# Patient Record
Sex: Female | Born: 1950 | Race: Black or African American | Hispanic: No | Marital: Married | Smoking: Never smoker
Health system: Southern US, Community
[De-identification: ages and names within clinical notes are randomized; demographics above are authoritative.]

## PROBLEM LIST (undated history)

## (undated) DIAGNOSIS — I10 Essential (primary) hypertension: Secondary | ICD-10-CM

## (undated) DIAGNOSIS — Q828 Other specified congenital malformations of skin: Secondary | ICD-10-CM

## (undated) DIAGNOSIS — M7580 Other shoulder lesions, unspecified shoulder: Secondary | ICD-10-CM

## (undated) DIAGNOSIS — K649 Unspecified hemorrhoids: Secondary | ICD-10-CM

## (undated) DIAGNOSIS — M179 Osteoarthritis of knee, unspecified: Secondary | ICD-10-CM

## (undated) DIAGNOSIS — M1990 Unspecified osteoarthritis, unspecified site: Secondary | ICD-10-CM

## (undated) DIAGNOSIS — E66811 Obesity, class 1: Secondary | ICD-10-CM

## (undated) DIAGNOSIS — E119 Type 2 diabetes mellitus without complications: Secondary | ICD-10-CM

## (undated) DIAGNOSIS — M24111 Other articular cartilage disorders, right shoulder: Secondary | ICD-10-CM

## (undated) DIAGNOSIS — J309 Allergic rhinitis, unspecified: Secondary | ICD-10-CM

## (undated) DIAGNOSIS — D649 Anemia, unspecified: Secondary | ICD-10-CM

## (undated) DIAGNOSIS — R011 Cardiac murmur, unspecified: Secondary | ICD-10-CM

## (undated) DIAGNOSIS — E669 Obesity, unspecified: Secondary | ICD-10-CM

## (undated) DIAGNOSIS — H60549 Acute eczematoid otitis externa, unspecified ear: Secondary | ICD-10-CM

## (undated) DIAGNOSIS — M75121 Complete rotator cuff tear or rupture of right shoulder, not specified as traumatic: Secondary | ICD-10-CM

## (undated) DIAGNOSIS — I319 Disease of pericardium, unspecified: Secondary | ICD-10-CM

## (undated) HISTORY — PX: COLONOSCOPY: SHX174

## (undated) HISTORY — DX: Essential (primary) hypertension: I10

## (undated) HISTORY — PX: BACK SURGERY: SHX140

## (undated) HISTORY — DX: Allergic rhinitis, unspecified: J30.9

## (undated) HISTORY — PX: TUBAL LIGATION: SHX77

## (undated) HISTORY — PX: TONSILLECTOMY: SUR1361

## (undated) HISTORY — PX: PRE-MALIGNANT / BENIGN SKIN LESION EXCISION: SHX160

## (undated) HISTORY — DX: Acute eczematoid otitis externa, unspecified ear: H60.549

## (undated) HISTORY — PX: ENDOMETRIAL BIOPSY: SHX622

## (undated) HISTORY — DX: Obesity, class 1: E66.811

## (undated) HISTORY — DX: Obesity, unspecified: E66.9

---

## 1988-05-23 HISTORY — PX: SALPINGECTOMY: SHX328

## 2005-01-26 ENCOUNTER — Ambulatory Visit: Payer: Self-pay | Admitting: Unknown Physician Specialty

## 2005-01-31 ENCOUNTER — Ambulatory Visit: Payer: Self-pay | Admitting: Internal Medicine

## 2005-11-21 ENCOUNTER — Ambulatory Visit: Payer: Self-pay | Admitting: Internal Medicine

## 2006-02-01 ENCOUNTER — Ambulatory Visit: Payer: Self-pay | Admitting: Unknown Physician Specialty

## 2007-03-15 ENCOUNTER — Ambulatory Visit: Payer: Self-pay | Admitting: Unknown Physician Specialty

## 2007-03-29 ENCOUNTER — Ambulatory Visit: Payer: Self-pay | Admitting: Gastroenterology

## 2007-05-24 HISTORY — PX: ROTATOR CUFF REPAIR: SHX139

## 2008-03-19 ENCOUNTER — Ambulatory Visit: Payer: Self-pay | Admitting: Unknown Physician Specialty

## 2009-04-30 ENCOUNTER — Ambulatory Visit: Payer: Self-pay | Admitting: Unknown Physician Specialty

## 2009-11-25 ENCOUNTER — Ambulatory Visit: Payer: Self-pay | Admitting: General Surgery

## 2010-05-05 ENCOUNTER — Ambulatory Visit: Payer: Self-pay | Admitting: Unknown Physician Specialty

## 2010-05-23 HISTORY — PX: OTHER SURGICAL HISTORY: SHX169

## 2011-05-11 ENCOUNTER — Ambulatory Visit: Payer: Self-pay | Admitting: Unknown Physician Specialty

## 2012-03-19 ENCOUNTER — Ambulatory Visit: Payer: Self-pay | Admitting: Family Medicine

## 2013-02-08 ENCOUNTER — Other Ambulatory Visit: Payer: Self-pay | Admitting: Unknown Physician Specialty

## 2013-02-08 DIAGNOSIS — Z1231 Encounter for screening mammogram for malignant neoplasm of breast: Secondary | ICD-10-CM

## 2013-05-17 ENCOUNTER — Ambulatory Visit: Payer: Self-pay

## 2014-06-12 ENCOUNTER — Ambulatory Visit: Payer: Self-pay

## 2014-11-20 ENCOUNTER — Ambulatory Visit (INDEPENDENT_AMBULATORY_CARE_PROVIDER_SITE_OTHER): Payer: BC Managed Care – PPO

## 2014-11-20 ENCOUNTER — Other Ambulatory Visit: Payer: Self-pay | Admitting: *Deleted

## 2014-11-20 ENCOUNTER — Ambulatory Visit (INDEPENDENT_AMBULATORY_CARE_PROVIDER_SITE_OTHER): Payer: BC Managed Care – PPO | Admitting: Podiatry

## 2014-11-20 ENCOUNTER — Encounter: Payer: Self-pay | Admitting: Podiatry

## 2014-11-20 VITALS — BP 135/82 | HR 67 | Resp 16 | Ht 66.0 in | Wt 195.0 lb

## 2014-11-20 DIAGNOSIS — I10 Essential (primary) hypertension: Secondary | ICD-10-CM | POA: Insufficient documentation

## 2014-11-20 DIAGNOSIS — K649 Unspecified hemorrhoids: Secondary | ICD-10-CM | POA: Insufficient documentation

## 2014-11-20 DIAGNOSIS — Q828 Other specified congenital malformations of skin: Secondary | ICD-10-CM

## 2014-11-20 DIAGNOSIS — R52 Pain, unspecified: Secondary | ICD-10-CM | POA: Diagnosis not present

## 2014-11-20 DIAGNOSIS — M1990 Unspecified osteoarthritis, unspecified site: Secondary | ICD-10-CM | POA: Insufficient documentation

## 2014-11-21 NOTE — Progress Notes (Signed)
Patient ID: Carollee MassedLynnetta S Korell, female   DOB: 28-Nov-1950, 64 y.o.   MRN: [id number]  Subjective: 64 year old female presents the office today for concerns of the thick callus. Upon her left foot overlying the site of the prior incision with scar tissue. She states that she previously had surgery to the area several years ago and subsequent developed a thick callus over the incision. She states that she comes in periodically to have the area trimmed once it becomes thick and painful. She states that she has started to have pain to the area to weightbearing and pressure to the area which makes shoe gear difficult. She denies any redness or any drainage along the area. No other complaints at this time. No acute changes since last appointment. Denies any systemic complaints as fevers, chills, nausea, vomiting.  Objective: AAO x3, NAD DP/PT pulses palpable bilaterally, CRT less than 3 seconds Protective sensation intact with Simms Weinstein monofilament, vibratory sensation intact, Achilles tendon reflex intact Along the plantar aspect of the foot submetatarsal 4 there is a thick hyperkeratotic lesion over the site of the prior incision. There is tenderness to palpation directly overlying this area. Upon debridement there is 2 areas of annular thick hyperkeratotic tissue. Upon debridement no underlying ulceration, drainage or other clinical signs of infection. No other areas of tenderness to bilateral lower extremities. MMT 5/5, ROM WNL.  No open lesions or pre-ulcerative lesions.  No overlying edema, erythema, increase in warmth to bilateral lower extremities.  No pain with calf compression, swelling, warmth, erythema bilaterally.   Assessment: 64 year old female with left second metatarsal 4 porokeratosis/hyperkeratotic lesion  Plan: -X-rays were obtained and reviewed with the patient.  -Treatment options discussed including all alternatives, risks, and complications -Lesion shop and debrided without  complication/bleeding. Upon was placed around the area followed by salinocaine and an occlusive bandage. Post-procedure care was discussed. Monitor for any clinical signs or symptoms of infection and directed to call the office immediately should any occur or go to the ER. -Follow-up if the symptoms are not resolved in 4-6 weeks or sooner should any problems arise. In the meantime, call the office with any questions/concerns/change in symptoms.   Ovid CurdMatthew Wagoner, DPM

## 2015-05-14 ENCOUNTER — Other Ambulatory Visit: Payer: Self-pay | Admitting: Certified Nurse Midwife

## 2015-05-14 DIAGNOSIS — Z1231 Encounter for screening mammogram for malignant neoplasm of breast: Secondary | ICD-10-CM

## 2015-06-15 ENCOUNTER — Ambulatory Visit
Admission: RE | Admit: 2015-06-15 | Discharge: 2015-06-15 | Disposition: A | Discharge status: Home / Self Care | Payer: BC Managed Care – PPO | Source: Ambulatory Visit | Attending: Certified Nurse Midwife | Admitting: Certified Nurse Midwife

## 2015-06-15 DIAGNOSIS — Z1231 Encounter for screening mammogram for malignant neoplasm of breast: Secondary | ICD-10-CM | POA: Diagnosis present

## 2015-09-01 ENCOUNTER — Encounter: Payer: Self-pay | Admitting: Podiatry

## 2015-09-01 ENCOUNTER — Ambulatory Visit (INDEPENDENT_AMBULATORY_CARE_PROVIDER_SITE_OTHER): Payer: BC Managed Care – PPO | Admitting: Podiatry

## 2015-09-01 DIAGNOSIS — M79673 Pain in unspecified foot: Secondary | ICD-10-CM

## 2015-09-01 DIAGNOSIS — Q828 Other specified congenital malformations of skin: Secondary | ICD-10-CM

## 2015-09-01 NOTE — Progress Notes (Signed)
Patient ID: Krista MassedLynnetta S Morris, female   DOB: 01/30/1951, 65 y.o.   MRN: [id number]  Subjective: 65 year old female presents the office today for concerns of the thick callus to the ball the left fourth toe and both fifth toes. The areas are painful with pressure in shoe gear. No redness or swelling. She did trim her left big toenail and had some bleeding but denies any swelling or redness or any drainage or pus at this time. No other complaints at this time.  Denies any systemic complaints as fevers, chills, nausea, vomiting.  Objective: AAO x3, NAD DP/PT pulses palpable bilaterally, CRT less than 3 seconds Along the plantar aspect of the foot submetatarsal 4 there is a thick hyperkeratotic lesion over the site of a prior incision. There is tenderness to palpation directly overlying this area. Hyperkeratotic lesions bilateral fifth toes. Upon debridement there is no underlying ulceration, drainage or other signs of infection to any lesions. The left big toenail has been trimming very sure there is evidence of dried blood. There is no signs of infection at this time. No swelling or redness or any drainage. No other areas of tenderness to bilateral lower extremities. MMT 5/5, ROM WNL.  No open lesions or pre-ulcerative lesions.  No overlying edema, erythema, increase in warmth to bilateral lower extremities.  No pain with calf compression, swelling, warmth, erythema bilaterally.   Assessment: 65 year old female with left second metatarsal 4, fifth digit bilaterally porokeratosis/hyperkeratotic lesion  Plan: -X-rays were obtained and reviewed with the patient.  -Treatment options discussed including all alternatives, risks, and complications -Calluses debrided 3 without complications or bleeding. -Recommended Epson salt soaks and Neosporin and a bandage to the left big toe. Monitor for signs or symptoms of infection/ingrown toenail. -Follow-up as needed.  Ovid CurdMatthew Wagoner, DPM

## 2016-06-06 ENCOUNTER — Other Ambulatory Visit: Payer: Self-pay | Admitting: Certified Nurse Midwife

## 2016-06-06 DIAGNOSIS — Z1231 Encounter for screening mammogram for malignant neoplasm of breast: Secondary | ICD-10-CM

## 2016-07-05 ENCOUNTER — Ambulatory Visit
Admission: RE | Admit: 2016-07-05 | Discharge: 2016-07-05 | Disposition: A | Discharge status: Home / Self Care | Payer: BC Managed Care – PPO | Source: Ambulatory Visit | Attending: Certified Nurse Midwife | Admitting: Certified Nurse Midwife

## 2016-07-05 DIAGNOSIS — Z1231 Encounter for screening mammogram for malignant neoplasm of breast: Secondary | ICD-10-CM | POA: Diagnosis not present

## 2016-09-19 ENCOUNTER — Ambulatory Visit (INDEPENDENT_AMBULATORY_CARE_PROVIDER_SITE_OTHER): Payer: BC Managed Care – PPO | Admitting: Podiatry

## 2016-09-19 DIAGNOSIS — Q828 Other specified congenital malformations of skin: Secondary | ICD-10-CM

## 2016-09-19 DIAGNOSIS — L84 Corns and callosities: Secondary | ICD-10-CM

## 2016-09-19 NOTE — Progress Notes (Signed)
This patient presents the office with chief complaint of painful calluses on the bottom of her left foot and on her fifth toes both feet. She states that there is a painful from walking and wearing her shoes. She is unable to self treat. She presents the office today for evaluation and treatment of these painful calluses  GENERAL APPEARANCE: Alert, conversant. Appropriately groomed. No acute distress.  VASCULAR: Pedal pulses are  palpable at  [facility] and PT bilateral.  Capillary refill time is immediate to all digits,  Normal temperature gradient.  Digital hair growth is present bilateral  NEUROLOGIC: sensation is normal to 5.07 monofilament at 5/5 sites bilateral.  Light touch is intact bilateral, Muscle strength normal.  MUSCULOSKELETAL: acceptable muscle strength, tone and stability bilateral.  Intrinsic muscluature intact bilateral.  Rectus appearance of foot and digits noted bilateral.  Hammer toes  B/L  DERMATOLOGIC: skin color, texture, and turgor are within normal limits.  No preulcerative lesions or ulcers  are seen, no interdigital maceration noted.  No open lesions present.  Digital nails are asymptomatic. No drainage noted. Thick hyperkeratotic lesion over the plate previous plantar incision left foot. This is present, sub-fourth left. She also has painful heloma dorms fifth toes bilaterally   Porokeratosis left foot.  Heloma durum fifth toes  B/L  Debridement of hyperkeratotic lesions  B/L. Helane Gunther DPM

## 2017-02-03 ENCOUNTER — Other Ambulatory Visit: Payer: Self-pay | Admitting: Certified Nurse Midwife

## 2017-05-18 ENCOUNTER — Ambulatory Visit (INDEPENDENT_AMBULATORY_CARE_PROVIDER_SITE_OTHER): Payer: BC Managed Care – PPO | Admitting: Certified Nurse Midwife

## 2017-05-18 ENCOUNTER — Encounter: Payer: Self-pay | Admitting: Certified Nurse Midwife

## 2017-05-18 VITALS — BP 138/72 | HR 57 | Ht 66.0 in | Wt 207.0 lb

## 2017-05-18 DIAGNOSIS — Z124 Encounter for screening for malignant neoplasm of cervix: Secondary | ICD-10-CM

## 2017-05-18 DIAGNOSIS — Z01419 Encounter for gynecological examination (general) (routine) without abnormal findings: Secondary | ICD-10-CM

## 2017-05-18 DIAGNOSIS — Z1231 Encounter for screening mammogram for malignant neoplasm of breast: Secondary | ICD-10-CM | POA: Diagnosis not present

## 2017-05-18 DIAGNOSIS — Z1239 Encounter for other screening for malignant neoplasm of breast: Secondary | ICD-10-CM

## 2017-05-18 LAB — HM PAP SMEAR: HM PAP: NORMAL

## 2017-05-18 MED ORDER — CLOTRIMAZOLE-BETAMETHASONE 1-0.05 % EX CREA: 1.0000 "application " | TOPICAL_CREAM | Freq: Two times a day (BID) | CUTANEOUS | 0 refills | Status: DC

## 2017-05-18 NOTE — Progress Notes (Signed)
Gynecology Annual Exam  PCP: System, Provider Not In  Chief Complaint:  Chief Complaint  Patient presents with  . Gynecologic Exam    History of Present Illness:Mckyla Azucena CecilBurton is a 66 year old African American/Black female , G 3 P 2 0 1 2 , who presents for her annual exam . She is not having any significant gyn problems. Was using Estrace cream last year, but ran out and did not get it refilled  Her menses are absent and she is postmenopausal. She does have vaginal dryness and that responds to Estrace cream 2x/week. Vasomotor symptoms are no longer problematic.Marland Kitchen.  She has had no spotting.   The patient's past medical history is notable for a history of hypertension, obesity, and lower back pain which she states is from arthritis. 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 There is a scarring there that results in a fold of tissue prone to sweating and irritation. Is area has responded to MYcolog in the past. She requests a refill of the Mycolog since her symptoms have returned.  Since her last annual GYN exam dated 05/13/2016, she has maintained an exercise program. She has received her flu shot this year. She was also treated for a sinusitis in November. She is   Her most recent pap smear was obtained 05/13/2016 and was NIL/ negative HRHPV  Her most recent mammogram obtained on 07/05/2016  was normal.  There is a positive history of breast cancer in her two paternal cousins-aged 53 and 40. Genetic testing has not been done.  There is no family history of ovarian cancer.  The patient does do occ monthly self breast exams.  She had a colonoscopy in 2015 that was normal . Her next colonoscopy is due in 7-10 years.  She denies a recent DEXA scan and [facility]  The patient does not smoke.  The patient does not drink alcohol.  The patient does not use illegal drugs.  The patient  exercises regularly The patient does get adequate calcium in her diet.  She had a recent cholesterol screen in 2018 by PCP Dr Burnett ShengHedrick that was normal.      The patient denies current symptoms of depression.    Review of Systems: Review of Systems  Constitutional: Negative for chills, fever and weight loss.  HENT: Negative for congestion, sinus pain and sore throat.   Eyes: Negative for blurred vision and pain.  Respiratory: Negative for hemoptysis, shortness of breath and wheezing.   Cardiovascular: Negative for chest pain, palpitations and leg swelling.  Gastrointestinal: Negative for abdominal pain, blood in stool, diarrhea, heartburn, nausea and vomiting.  Genitourinary: Negative for dysuria, frequency, hematuria and urgency.  Musculoskeletal: Positive for back pain. Negative for joint pain and myalgias.  Skin: Negative for itching and rash.  Neurological: Negative for dizziness, tingling and headaches.  Endo/Heme/Allergies: Negative for environmental allergies and polydipsia. Does not bruise/bleed easily.       Negative for hirsutism   Psychiatric/Behavioral: Negative for depression. The patient is not nervous/anxious and does not have insomnia.     Past Medical History:  Past Medical History:  Diagnosis Date  . Allergic rhinitis   . Hypertension     Past Surgical History:  Past Surgical History:  Procedure Laterality Date  . bone spur removal  2012   from foot  . CESAREAN SECTION  1982, [phone]  Dr Francoise Schaumann  . COLONOSCOPY  2004, 2015   hemorrhoids in 2004 and in 2015 normal screening  . ENDOMETRIAL BIOPSY  2000, 2002   disordered proliferative; weakly proliferative  . PRE-MALIGNANT / BENIGN SKIN LESION EXCISION     right thigh; ulcerated fibroepithelial polyp  . ROTATOR CUFF REPAIR Right 2009  . SALPINGECTOMY Right 1990   ectopic   . TONSILLECTOMY    . TUBAL LIGATION      Family History:  Family History  Problem Relation Age of Onset  . Hypertension Mother   .  Diabetes Father        died from complications of DM  . Hypertension Sister   . Hypertension Brother   . Breast cancer Cousin        paternal x2, age 4 and age 49    Social History:  Social History   Socioeconomic History  . Marital status: Married    Spouse name: Brett Canales  . Number of children: 2  . Years of education: 51  . Highest education level: Not on file  Social Needs  . Financial resource strain: Not on file  . Food insecurity - worry: Not on file  . Food insecurity - inability: Not on file  . Transportation needs - medical: Not on file  . Transportation needs - non-medical: Not on file  Occupational History  . Occupation: Geneticist, molecular  Tobacco Use  . Smoking status: Never Smoker  . Smokeless tobacco: Never Used  Substance and Sexual Activity  . Alcohol use: No    Alcohol/week: 0.0 oz    Frequency: Never  . Drug use: No  . Sexual activity: Yes    Partners: Male    Birth control/protection: Post-menopausal  Other Topics Concern  . Not on file  Social History Narrative  . Not on file    Allergies:  Allergies  Allergen Reactions  . Metronidazole Other (See Comments)    Medications:  Current Outpatient Medications:  .  amLODipine (NORVASC) 10 MG tablet, Take 10 mg by mouth daily., Disp: , Rfl: 2 .  Cholecalciferol (VITAMIN D3) 1000 UNITS CAPS, Take by mouth., Disp: , Rfl:  .  hydrochlorothiazide (HYDRODIURIL) 25 MG tablet, Take by mouth., Disp: , Rfl:  .  losartan (COZAAR) 50 MG tablet, Take 50 mg by mouth daily., Disp: , Rfl: 3 .  metoprolol tartrate (LOPRESSOR) 50 MG tablet, TAKE 1 AND 1/2 TABLETS BY MOUTH TWICE A DAY, Disp: , Rfl:  .  nystatin-triamcinolone ointment (MYCOLOG), APPLY TO AFFECTED AREA 2 -3 TIMES DAILY, Disp: , Rfl:  .  triamcinolone cream (KENALOG) 0.1 %, APPLY TOPICALLY 2 (TWO) TIMES DAILY. DO NOT USE LONGER THAN 2 CONSECUTIVE WEEKS., Disp: , Rfl: 0 .  clotrimazole-betamethasone (LOTRISONE) cream, Apply 1 application topically 2 (two)  times daily., Disp: 30 g, Rfl: 0   Physical Exam Vitals: BP 138/72   Pulse (!) 57   Ht 5\' 6"  (1.676 m)   Wt 207 lb (93.9 kg)   BMI 33.41 kg/m   General: BF in NAD HEENT: normocephalic, anicteric Neck: no thyroid enlargement, no palpable nodules, no cervical lymphadenopathy  Pulmonary: No increased work of breathing, CTAB Cardiovascular: RRR, grade II-III/VI systolic murmur at base  Breast: Breast symmetrical, no tenderness, no palpable nodules or masses, no skin or nipple retraction present, no nipple discharge.  No axillary, infraclavicular or supraclavicular lymphadenopathy. Abdomen: Soft, non-tender, non-distended.  Umbilicus without lesions.  No hepatomegaly or masses palpable. No evidence of hernia. Vertical and transverse scars on lower  abdomen intersect. There is a keloid on the vertical scar that causes the skin above and below the transverse incision to press together. There are several areas of  Inflammation on either side of the keloid. Genitourinary:  External: Normal external female genitalia.  Normal urethral meatus, normal Bartholin's and Skene's glands.    Vagina: Normal vaginal mucosa, no evidence of prolapse.    Cervix: Grossly normal in appearance, no bleeding, non-tender  Uterus: Anteverted, normal size, shape, and consistency, mobile, and non-tender  Adnexa: No adnexal masses, non-tender  Rectal: deferred  Lymphatic: no evidence of inguinal lymphadenopathy Extremities: no edema, erythema, or tenderness Neurologic: Grossly intact Psychiatric: mood appropriate, affect full     Assessment: 66 y.o. annual gyn exam Possible fungal infection lower abdomen  Lotrisone BID to AA prn. Gold bond after inflammation clears.  Plan:                                                                                                                                                                                                 1) Breast cancer 1) 1) Breast cancers creening -  recommend monthly self breast exam and continued annual mammograms Mammogram was ordered today.  2 Colon cancer screening: Colonoscopy  is UTD.Marland Kitchen.  3) Cervical cancer screening - Pap was done. ASCCP guidelines and rational discussed.  Patient opts for every 2 years screening interval  4) Osteoporosis screening-Dexa scan next year.  5) Routine healthcare maintenance including cholesterol and diabetes screening managed by PCP  6) RTO 1 year and prn  Farrel Connersolleen Curley [place], CNM

## 2017-05-19 LAB — IGP,RFX APTIMA HPV ALL PTH: PAP SMEAR COMMENT: 0

## 2017-05-21 ENCOUNTER — Encounter: Payer: Self-pay | Admitting: Certified Nurse Midwife

## 2017-07-06 ENCOUNTER — Ambulatory Visit
Admission: RE | Admit: 2017-07-06 | Discharge: 2017-07-06 | Disposition: A | Discharge status: Home / Self Care | Payer: BC Managed Care – PPO | Source: Ambulatory Visit | Attending: Certified Nurse Midwife | Admitting: Certified Nurse Midwife

## 2017-07-06 DIAGNOSIS — Z1231 Encounter for screening mammogram for malignant neoplasm of breast: Secondary | ICD-10-CM | POA: Diagnosis present

## 2017-07-06 DIAGNOSIS — Z1239 Encounter for other screening for malignant neoplasm of breast: Secondary | ICD-10-CM

## 2018-05-24 ENCOUNTER — Encounter: Payer: Self-pay | Admitting: Certified Nurse Midwife

## 2018-05-24 ENCOUNTER — Ambulatory Visit (INDEPENDENT_AMBULATORY_CARE_PROVIDER_SITE_OTHER): Payer: Medicare Other | Admitting: Certified Nurse Midwife

## 2018-05-24 VITALS — BP 124/72 | Ht 65.0 in | Wt 208.0 lb

## 2018-05-24 DIAGNOSIS — Z1239 Encounter for other screening for malignant neoplasm of breast: Secondary | ICD-10-CM

## 2018-05-24 DIAGNOSIS — Z01419 Encounter for gynecological examination (general) (routine) without abnormal findings: Secondary | ICD-10-CM | POA: Diagnosis not present

## 2018-05-24 DIAGNOSIS — N9419 Other specified dyspareunia: Secondary | ICD-10-CM

## 2018-05-24 DIAGNOSIS — Z780 Asymptomatic menopausal state: Secondary | ICD-10-CM

## 2018-05-24 MED ORDER — ESTRADIOL STARTER PACK 4 MCG VA INST: 1.0000 | VAGINAL_INSERT | Freq: Every day | VAGINAL | 0 refills | Status: DC

## 2018-05-24 MED ORDER — ESTRADIOL 4 MCG VA INST: 1.0000 | VAGINAL_INSERT | VAGINAL | 11 refills | Status: DC

## 2018-05-24 NOTE — Progress Notes (Signed)
Gynecology Annual Exam  PCP: System, Provider Not In  Chief Complaint:  Chief Complaint  Patient presents with  . Annual Exam    History of Present Illness:Krista Morris is a 68 year old African American/Black female , G 3 P 2 0 1 2 , who presents for her annual exam . She is having problems with vaginal dryness. Used Estrace cream in the past. Is interested in other options. Her menses are absent and she is postmenopausal.  Vasomotor symptoms are no longer problematic.Marland Kitchen.  She has had no spotting.   The patient's past medical history is notable for a history of hypertension, obesity, and lower back pain which she states is from arthritis. 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 There is a scarring there that results in a fold of tissue prone to sweating and irritation. Is area has responded to MYcolog in the past.  Since her last annual GYN exam dated 05/18/2017, she has retired from Agricultural consultantteaching and has been exercising more.  She has received her flu shot this year.   Her most recent pap smear was obtained 05/18/17 and was NIL. Her most recent mammogram obtained on 07/06/17 was normal.  There is a positive history of breast cancer in her two paternal cousins-aged 66 and 40. Genetic testing has not been done.  There is no family history of ovarian cancer.  The patient does do occ monthly self breast exams.  She had a colonoscopy in 2015 that was normal . Her next colonoscopy is due in 7-10 years.  She denies a recent DEXA scan and is eligible The patient does not smoke.  The patient does not drink alcohol.  The patient does not use illegal drugs.  The patient exercises regularly (water aerobics and Zumba) The patient does get adequate calcium in her diet.  She had a recent cholesterol screen in 2019 by PCP Dr Burnett ShengHedrick that was normal.      The patient denies current symptoms of depression.     Review of Systems: Review of Systems  Constitutional: Negative for chills, fever and weight loss.  HENT: Negative for congestion, sinus pain and sore throat.   Eyes: Negative for blurred vision and pain.  Respiratory: Negative for hemoptysis, shortness of breath and wheezing.   Cardiovascular: Negative for chest pain, palpitations and leg swelling.  Gastrointestinal: Negative for abdominal pain, blood in stool, diarrhea, heartburn, nausea and vomiting.  Genitourinary: Positive for frequency and urgency. Negative for dysuria and hematuria.       Positive for painful intercourse, vaginal dryness  Musculoskeletal: Positive for back pain. Negative for joint pain and myalgias.  Skin: Positive for itching. Negative for rash.  Neurological: Negative for dizziness, tingling and headaches.  Endo/Heme/Allergies: Negative for environmental allergies and polydipsia. Does not bruise/bleed easily.       Negative for hirsutism   Psychiatric/Behavioral: Negative for depression. The patient is not nervous/anxious and does not have insomnia.     Past Medical History:  Past Medical History:  Diagnosis Date  . Allergic rhinitis   . Hypertension     Past Surgical History:  Past Surgical History:  Procedure Laterality Date  . bone spur removal  2012   from foot  . CESAREAN SECTION  1982, 1985   Dr Francoise SchaumannBaird  . COLONOSCOPY  2004, 2015   hemorrhoids in 2004 and in 2015 normal screening  . ENDOMETRIAL BIOPSY  2000, 2002  disordered proliferative; weakly proliferative  . PRE-MALIGNANT / BENIGN SKIN LESION EXCISION     right thigh; ulcerated fibroepithelial polyp  . ROTATOR CUFF REPAIR Right 2009  . SALPINGECTOMY Right 1990   ectopic   . TONSILLECTOMY    . TUBAL LIGATION      Family History:  Family History  Problem Relation Age of Onset  . Hypertension Mother   . Diabetes Father        died from complications of DM  . Hypertension Sister   . Hypertension Brother   . Breast cancer Cousin         paternal x2, age 66 and age 66    Social History:  Social History   Socioeconomic History  . Marital status: Married    Spouse name: Brett CanalesSteve  . Number of children: 2  . Years of education: 4816  . Highest education level: Not on file  Occupational History  . Occupation: Geneticist, molecularAcademic Coach  Social Needs  . Financial resource strain: Not on file  . Food insecurity:    Worry: Not on file    Inability: Not on file  . Transportation needs:    Medical: Not on file    Non-medical: Not on file  Tobacco Use  . Smoking status: Never Smoker  . Smokeless tobacco: Never Used  Substance and Sexual Activity  . Alcohol use: No    Alcohol/week: 0.0 standard drinks    Frequency: Never  . Drug use: No  . Sexual activity: Yes    Partners: Male    Birth control/protection: Post-menopausal  Lifestyle  . Physical activity:    Days per week: 0 days    Minutes per session: 0 min  . Stress: Only a little  Relationships  . Social connections:    Talks on phone: More than three times a week    Gets together: Once a week    Attends religious service: More than 4 times per year    Active member of club or organization: Yes    Attends meetings of clubs or organizations: More than 4 times per year    Relationship status: Married  . Intimate partner violence:    Fear of current or ex partner: No    Emotionally abused: No    Physically abused: No    Forced sexual activity: No  Other Topics Concern  . Not on file  Social History Narrative  . Not on file    Allergies:  Allergies  Allergen Reactions  . Metronidazole Other (See Comments)    Medications:  Current Outpatient Medications:  .  amLODipine (NORVASC) 10 MG tablet, Take 10 mg by mouth daily., Disp: , Rfl: 2 .  Cholecalciferol (VITAMIN D3) 1000 UNITS CAPS, Take by mouth., Disp: , Rfl:  .  clotrimazole-betamethasone (LOTRISONE) cream, Apply 1 application topically 2 (two) times daily., Disp: 30 g, Rfl: 0 .  hydrochlorothiazide  (HYDRODIURIL) 25 MG tablet, Take by mouth., Disp: , Rfl:  .  losartan (COZAAR) 50 MG tablet, Take 50 mg by mouth daily., Disp: , Rfl: 3 .  metoprolol tartrate (LOPRESSOR) 50 MG tablet, TAKE 1 AND 1/2 TABLETS BY MOUTH TWICE A DAY, Disp: , Rfl:  .  nystatin-triamcinolone ointment (MYCOLOG), APPLY TO AFFECTED AREA 2 -3 TIMES DAILY, Disp: , Rfl:  .  triamcinolone cream (KENALOG) 0.1 %, APPLY TOPICALLY 2 (TWO) TIMES DAILY. DO NOT USE LONGER THAN 2 CONSECUTIVE WEEKS., Disp: , Rfl: 0 Zyrtec 10 mgm qhs  Physical Exam Vitals: BP 124/72  Ht 5\' 5"  (1.651 m)   Wt 208 lb (94.3 kg)   BMI 34.61 kg/m   General: BF in NAD HEENT: normocephalic, anicteric Neck: no thyroid enlargement, no palpable nodules, no cervical lymphadenopathy  Pulmonary: No increased work of breathing, CTAB Cardiovascular: RRR, grade II/VI systolic murmur at base  Breast: Breast symmetrical, no tenderness, no palpable nodules or masses, no skin or nipple retraction present, no nipple discharge.  No axillary, infraclavicular or supraclavicular lymphadenopathy. Abdomen: Soft, non-tender, non-distended.  Umbilicus without lesions.  No hepatomegaly or masses palpable. No evidence of hernia. Vertical and transverse scars on lower abdomen intersect. There is a keloid on the vertical scar that causes the skin above and below the transverse incision to press together.  Genitourinary:  External: Normal external female genitalia.  Normal urethral meatus, normal Bartholin's and Skene's glands.    Vagina: Normal vaginal mucosa, no evidence of prolapse.    Cervix: no masses, no bleeding, non-tender  Uterus: Anteverted, normal size, shape, and consistency, mobile, and non-tender  Adnexa: No adnexal masses, non-tender  Rectal: deferred  Lymphatic: no evidence of inguinal lymphadenopathy Extremities: no edema, erythema, or tenderness Neurologic: Grossly intact Psychiatric: mood appropriate, affect full     Assessment: 67 y.o. annual gyn  exam Dyspareunia due to vaginal dryness Plan:                                                                                                                                                                                                1) Breast cancers creening - recommend monthly self breast exam and continued annual mammograms Mammogram was ordered today.  2 Colon cancer screening: Colonoscopy  is UTD.Marland Kitchen  3) Cervical cancer screening - Pap was not done. ASCCP guidelines and rational discussed.  Patient opts for every 2 years screening interval. Next due in 2021. Discussed option for treatment of genital atrophy. Patient desires to try Imvexxy. Given samples of 4 mcg. Insert qhs x 2 weeks then 2x/wk.   4) Osteoporosis screening-Dexa scan ordered  5) Routine healthcare maintenance including cholesterol and diabetes screening managed by PCP  6) RTO 1 year and prn  Farrel Conners, CNM

## 2018-05-25 ENCOUNTER — Telehealth: Payer: Self-pay | Admitting: Certified Nurse Midwife

## 2018-05-25 NOTE — Telephone Encounter (Signed)
-----   Message from Farrel Conners, [place] sent at 05/24/2018 10:16 PM EST ----- Regarding: DEXA scan [facility] schedule D[facility]A scan at [facility]

## 2018-05-25 NOTE — Telephone Encounter (Signed)
Patient is aware of DEXA and screening mammogram on Monday, 07/09/2018 @ 10:20am at [facility].

## 2018-07-09 ENCOUNTER — Ambulatory Visit
Admission: RE | Admit: 2018-07-09 | Discharge: 2018-07-09 | Disposition: A | Discharge status: Home / Self Care | Payer: Medicare Other | Source: Ambulatory Visit | Attending: Certified Nurse Midwife | Admitting: Certified Nurse Midwife

## 2018-07-09 DIAGNOSIS — Z780 Asymptomatic menopausal state: Secondary | ICD-10-CM

## 2018-07-09 DIAGNOSIS — Z1239 Encounter for other screening for malignant neoplasm of breast: Secondary | ICD-10-CM

## 2018-07-09 DIAGNOSIS — M8588 Other specified disorders of bone density and structure, other site: Secondary | ICD-10-CM | POA: Insufficient documentation

## 2018-07-09 DIAGNOSIS — Z1231 Encounter for screening mammogram for malignant neoplasm of breast: Secondary | ICD-10-CM | POA: Diagnosis not present

## 2018-07-16 ENCOUNTER — Telehealth: Payer: Self-pay | Admitting: Certified Nurse Midwife

## 2018-07-16 MED ORDER — ESTRADIOL 0.1 MG/GM VA CREA: TOPICAL_CREAM | VAGINAL | 1 refills | Status: DC

## 2018-07-16 NOTE — Telephone Encounter (Signed)
Patient called with results of mammogram and DEXA scan. Mammogram was negative. DExa scan T score of spine -1.3 and or femur neck 0.2. Frax scores 2.7% and 0.1%.  Would like to switch from Imvexy to Estrace vaginal cream RX for Estrace called in.  Farrel Conners, CNM

## 2018-08-14 ENCOUNTER — Other Ambulatory Visit: Payer: Self-pay | Admitting: Certified Nurse Midwife

## 2019-01-08 ENCOUNTER — Other Ambulatory Visit: Payer: Self-pay | Admitting: Certified Nurse Midwife

## 2019-04-12 ENCOUNTER — Other Ambulatory Visit: Payer: Self-pay

## 2019-04-12 DIAGNOSIS — Z20822 Contact with and (suspected) exposure to covid-19: Secondary | ICD-10-CM

## 2019-04-15 LAB — NOVEL CORONAVIRUS, NAA: SARS-CoV-2, NAA: NOT DETECTED

## 2019-05-31 ENCOUNTER — Other Ambulatory Visit: Payer: Self-pay | Admitting: Certified Nurse Midwife

## 2019-05-31 DIAGNOSIS — Z1231 Encounter for screening mammogram for malignant neoplasm of breast: Secondary | ICD-10-CM

## 2019-06-13 ENCOUNTER — Ambulatory Visit: Payer: Medicare PPO | Attending: Internal Medicine

## 2019-06-13 DIAGNOSIS — Z23 Encounter for immunization: Secondary | ICD-10-CM

## 2019-06-17 NOTE — Progress Notes (Signed)
   Covid-19 Vaccination Clinic  Name:  Krista Morris    MRN: [id number] DOB: 16-Jun-1950  06/13/2019  [name] was observed post Covid-19 immunization for 15 minutes without incidence. She was provided with Vaccine Information Sheet and instruction to access the V-Safe system.   [name] was instructed to call 911 with any severe reactions post vaccine: Marland Kitchen Difficulty breathing  . Swelling of your face and throat  . A fast heartbeat  . A bad rash all over your body  . Dizziness and weakness    Immunizations Administered    Name Date Dose VIS Date Route   Moderna COVID-19 Vaccine 06/13/2019  6:43 PM 0.5 mL 04/23/2019 Intramuscular   Manufacturer: Moderna   Lot: [id number]   NDC: 62563-893-73

## 2019-06-24 ENCOUNTER — Other Ambulatory Visit (HOSPITAL_COMMUNITY)
Admission: RE | Admit: 2019-06-24 | Discharge: 2019-06-24 | Disposition: A | Discharge status: Home / Self Care | Payer: Medicare PPO | Source: Ambulatory Visit | Attending: Certified Nurse Midwife | Admitting: Certified Nurse Midwife

## 2019-06-24 ENCOUNTER — Encounter: Payer: Self-pay | Admitting: Certified Nurse Midwife

## 2019-06-24 ENCOUNTER — Other Ambulatory Visit: Payer: Self-pay

## 2019-06-24 ENCOUNTER — Ambulatory Visit (INDEPENDENT_AMBULATORY_CARE_PROVIDER_SITE_OTHER): Payer: Medicare PPO | Admitting: Certified Nurse Midwife

## 2019-06-24 VITALS — BP 130/76 | HR 65 | Ht 65.0 in | Wt 202.0 lb

## 2019-06-24 DIAGNOSIS — Z124 Encounter for screening for malignant neoplasm of cervix: Secondary | ICD-10-CM

## 2019-06-24 DIAGNOSIS — Z1231 Encounter for screening mammogram for malignant neoplasm of breast: Secondary | ICD-10-CM

## 2019-06-24 DIAGNOSIS — Z01419 Encounter for gynecological examination (general) (routine) without abnormal findings: Secondary | ICD-10-CM | POA: Diagnosis not present

## 2019-06-24 NOTE — Progress Notes (Signed)
Gynecology Annual Exam  PCP: System, Provider Not In  Chief Complaint:  Chief Complaint  Patient presents with  . Gynecologic Exam    History of Present Illness:Krista Morris is a 69 year old African American/Black female , G 3 P 2 0 1 2 , who presents for her annual exam . She has had problems with vaginal dryness and has tried Imvexxy and Estrace more recently. Has stopped using Estrace. Her menses are absent and she is postmenopausal.  Vasomotor symptoms are no longer problematic.Marland Kitchen  She has had no spotting.   The patient's past medical history is notable for a history of hypertension, obesity, and lower back pain which she states is from arthritis. 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 There is a scarring there that results in a fold of tissue prone to sweating and irritation. This  area has responded to MYcolog/ Lotrisone in the past. She has also had a recurring skin irritation under her breasts and athlete's foot.  Since her last annual GYN exam dated 05/24/2018, she has lost 6 #. She did receive her flu vaccine and her first dose of Covid 19 vaccine. She complains of urinary frequency and urgency incontinence "for a while". Has nocturia twice a night. The nocturia has decreased since her diuretic dose was decreased. She does drink fluids into the evening hours. Drinks at least 64 oz of water, 1 cup of coffee, one cup of tea,, and 8oz of soda a day. Denies dysuria.  Her most recent pap smear was obtained 05/18/17 and was NIL. Her most recent mammogram obtained on 07/09/2018 was normal.  There is a positive history of breast cancer in her two paternal cousins-aged 69 and 62. Genetic testing has not been done.  There is no family history of ovarian cancer.  The patient does do monthly self breast exams.  She had a colonoscopy in 2015 that was normal . Her next colonoscopy is due in 2025.  She had  a recent DEXA scan 07/09/2018. The T score for the femur was normal and the spine was -1.3 The patient does not smoke.  The patient does not drink alcohol.  The patient does not use illegal drugs.  The patient exercises regularly (doing Zumba) The patient does get adequate calcium in her diet.  She had a recent cholesterol screen in 2020 by PCP Dr Kary Kos that was normal and her hemoglobin A1C had improved and was 5.9%.     The patient denies current symptoms of depression.    Review of Systems: Review of Systems  Constitutional: Negative for chills, fever and weight loss.  HENT: Negative for congestion, sinus pain and sore throat.   Eyes: Negative for blurred vision and pain.  Respiratory: Negative for hemoptysis, shortness of breath and wheezing.   Cardiovascular: Negative for chest pain, palpitations and leg swelling.  Gastrointestinal: Negative for abdominal pain, blood in stool, diarrhea, heartburn, nausea and vomiting.  Genitourinary: Positive for frequency and urgency. Negative for dysuria and hematuria.       Positive for vaginal dryness and urgency incontinence  Musculoskeletal: Positive for back pain. Negative for joint pain and myalgias.  Skin: Positive for itching (under right breast). Negative for rash.  Neurological: Negative for dizziness, tingling and headaches.  Endo/Heme/Allergies: Negative for environmental allergies and polydipsia. Does not bruise/bleed easily.       Negative for hirsutism   Psychiatric/Behavioral: Negative for  depression. The patient is not nervous/anxious and does not have insomnia.     Past Medical History:  Past Medical History:  Diagnosis Date  . Allergic rhinitis   . Eczema of external ear   . Hypertension   . Obesity (BMI 30.0-34.9)     Past Surgical History:  Past Surgical History:  Procedure Laterality Date  . bone spur removal  2012   from foot  . CESAREAN SECTION  1982, 1985   Dr Francoise Schaumann  . COLONOSCOPY  2004, 2015    hemorrhoids in 2004 and in 2015 normal screening  . ENDOMETRIAL BIOPSY  2000, 2002   disordered proliferative; weakly proliferative  . PRE-MALIGNANT / BENIGN SKIN LESION EXCISION     right thigh; ulcerated fibroepithelial polyp  . ROTATOR CUFF REPAIR Right 2009  . SALPINGECTOMY Right 1990   ectopic   . TONSILLECTOMY    . TUBAL LIGATION      Family History:  Family History  Problem Relation Age of Onset  . Hypertension Mother   . Diabetes Father        died from complications of DM  . Hypertension Sister   . Hypertension Brother   . Breast cancer Cousin        paternal x2, age 50 and age 75    Social History:  Social History   Socioeconomic History  . Marital status: Married    Spouse name: Brett Canales  . Number of children: 2  . Years of education: 75  . Highest education level: Not on file  Occupational History  . Occupation: retired Runner, broadcasting/film/video  Tobacco Use  . Smoking status: Never Smoker  . Smokeless tobacco: Never Used  Substance and Sexual Activity  . Alcohol use: No    Alcohol/week: 0.0 standard drinks  . Drug use: No  . Sexual activity: Yes    Partners: Male    Birth control/protection: Post-menopausal  Other Topics Concern  . Not on file  Social History Narrative  . Not on file   Social Determinants of Health   Financial Resource Strain:   . Difficulty of Paying Living Expenses: Not on file  Food Insecurity:   . Worried About Programme researcher, broadcasting/film/video in the Last Year: Not on file  . Ran Out of Food in the Last Year: Not on file  Transportation Needs:   . Lack of Transportation (Medical): Not on file  . Lack of Transportation (Non-Medical): Not on file  Physical Activity:   . Days of Exercise per Week: Not on file  . Minutes of Exercise per Session: Not on file  Stress:   . Feeling of Stress : Not on file  Social Connections:   . Frequency of Communication with Friends and Family: Not on file  . Frequency of Social Gatherings with Friends and Family: Not on  file  . Attends Religious Services: Not on file  . Active Member of Clubs or Organizations: Not on file  . Attends Banker Meetings: Not on file  . Marital Status: Not on file  Intimate Partner Violence:   . Fear of Current or Ex-Partner: Not on file  . Emotionally Abused: Not on file  . Physically Abused: Not on file  . Sexually Abused: Not on file    Allergies:  Allergies  Allergen Reactions  . Metronidazole Other (See Comments)    Medications:  Current Outpatient Medications:  .  amLODipine (NORVASC) 10 MG tablet, Take 10 mg by mouth daily., Disp: , Rfl: 2 .  cetirizine (ZYRTEC) 10 MG tablet, Take 10 mg by mouth at bedtime., Disp: , Rfl:  .  Cholecalciferol (VITAMIN D3) 1000 UNITS CAPS, Take by mouth., Disp: , Rfl:  .  clotrimazole-betamethasone (LOTRISONE) cream, Apply 1 application topically 2 (two) times daily., Disp: 30 g, Rfl: 0 .  Fluocinolone Acetonide 0.01 % OIL, Place 2 drops into both ears daily., Disp: , Rfl:  .  hydrochlorothiazide (HYDRODIURIL) 12.5 MG tablet, Take 12.5 mg by mouth daily., Disp: , Rfl:  .  ipratropium (ATROVENT) 0.03 % nasal spray, Place 2 sprays into the nose as needed., Disp: , Rfl:  .  losartan (COZAAR) 50 MG tablet, Take 50 mg by mouth daily., Disp: , Rfl: 3 .  metoprolol tartrate (LOPRESSOR) 50 MG tablet, TAKE 1 AND 1/2 TABLETS BY MOUTH TWICE A DAY, Disp: , Rfl:  .  nystatin-triamcinolone ointment (MYCOLOG), APPLY TO AFFECTED AREA 2 -3 TIMES DAILY, Disp: 30 g, Rfl: 1 .  triamcinolone cream (KENALOG) 0.1 %, APPLY TOPICALLY 2 (TWO) TIMES DAILY. DO NOT USE LONGER THAN 2 CONSECUTIVE WEEKS., Disp: , Rfl: 0  Physical Exam Vitals: BP 130/76   Pulse 65   Ht 5\' 5"  (1.651 m)   Wt 202 lb (91.6 kg)   BMI 33.61 kg/m   General: BF in NAD HEENT: normocephalic, anicteric Neck: no thyroid enlargement, no palpable nodules, no cervical lymphadenopathy  Pulmonary: No increased work of breathing, CTAB Cardiovascular: RRR, without murmur   Breast: Breast symmetrical, no tenderness, no palpable nodules or masses, no skin or nipple retraction present, no nipple discharge.  No axillary, infraclavicular or supraclavicular lymphadenopathy. Mild inflammation below right breast Abdomen: Soft, non-tender, non-distended.  Umbilicus without lesions.  No hepatomegaly or masses palpable. No evidence of hernia. Vertical and transverse scars on lower abdomen intersect. There is a keloid on the vertical scar that causes the skin above and below the transverse incision to press together.  Genitourinary:  External: Normal external female genitalia.  Normal urethral meatus, normal Bartholin's and Skene's glands.    Vagina: Normal vaginal mucosa, no evidence of prolapse.    Cervix: no masses, no bleeding, non-tender  Uterus: Anteverted, normal size, shape, and consistency, mobile, and non-tender  Adnexa: No adnexal masses, non-tender  Rectal: deferred  Lymphatic: no evidence of inguinal lymphadenopathy Extremities: no edema, erythema, or tenderness Neurologic: Grossly intact Psychiatric: mood appropriate, affect full     Assessment: 69 y.o. annual gyn exam Urgency incontinence and frequency  May need to decrease fluid intake and stop drinking earlier in the afternoon   Plan:  1) Breast cancer screening - recommend monthly self breast exam and continued annual mammograms Mammogram was ordered today.  2 Colon cancer screening: Colonoscopy  is UTD.Marland Kitchen  3) Cervical cancer screening - Pap was done. ASCCP guidelines and rational discussed.  Patient opts for every 2 years screening interval. .   4) Osteoporosis screening-Aware of calcium and vitamin D3 requirements and role of exercise in preventing osteoporosis.   5) Routine healthcare maintenance including  cholesterol and diabetes screening managed by PCP  6) RTO 1 year and prn  Farrel Conners, CNM

## 2019-06-26 LAB — CYTOLOGY - PAP: Diagnosis: NEGATIVE

## 2019-07-11 ENCOUNTER — Ambulatory Visit: Payer: Medicare PPO

## 2019-07-11 ENCOUNTER — Encounter: Payer: Self-pay | Admitting: Certified Nurse Midwife

## 2019-07-16 ENCOUNTER — Ambulatory Visit: Payer: Medicare PPO | Attending: Internal Medicine

## 2019-07-16 DIAGNOSIS — Z23 Encounter for immunization: Secondary | ICD-10-CM

## 2019-07-16 NOTE — Progress Notes (Signed)
   Covid-19 Vaccination Clinic  Name:  [name]    MRN: [id number] DOB: 30-Jan-1951  07/16/2019  [name] was observed post Covid-19 immunization for 15 minutes without incidence. She was provided with Vaccine Information Sheet and instruction to access the V-Safe system.   [name] was instructed to call 911 with any severe reactions post vaccine: Marland Kitchen Difficulty breathing  . Swelling of your face and throat  . A fast heartbeat  . A bad rash all over your body  . Dizziness and weakness    Immunizations Administered    Name Date Dose VIS Date Route   Moderna COVID-19 Vaccine 07/16/2019  5:16 PM 0.5 mL 04/23/2019 Intramuscular   Manufacturer: Moderna   Lot: [id number]   NDC: 23799-094-00

## 2019-08-21 ENCOUNTER — Ambulatory Visit
Admission: RE | Admit: 2019-08-21 | Discharge: 2019-08-21 | Disposition: A | Discharge status: Home / Self Care | Payer: Medicare PPO | Source: Ambulatory Visit | Attending: Certified Nurse Midwife | Admitting: Certified Nurse Midwife

## 2019-08-21 DIAGNOSIS — Z1231 Encounter for screening mammogram for malignant neoplasm of breast: Secondary | ICD-10-CM | POA: Diagnosis not present

## 2019-09-18 ENCOUNTER — Encounter: Payer: Self-pay | Admitting: Dermatology

## 2019-10-02 ENCOUNTER — Ambulatory Visit: Payer: Medicare PPO | Admitting: Dermatology

## 2019-10-09 ENCOUNTER — Other Ambulatory Visit: Payer: Self-pay

## 2019-10-09 ENCOUNTER — Ambulatory Visit (INDEPENDENT_AMBULATORY_CARE_PROVIDER_SITE_OTHER): Payer: Medicare PPO | Admitting: Dermatology

## 2019-10-09 ENCOUNTER — Encounter: Payer: Medicare PPO | Admitting: Dermatology

## 2019-10-09 DIAGNOSIS — L300 Nummular dermatitis: Secondary | ICD-10-CM | POA: Diagnosis not present

## 2019-10-09 DIAGNOSIS — L304 Erythema intertrigo: Secondary | ICD-10-CM

## 2019-10-09 DIAGNOSIS — L720 Epidermal cyst: Secondary | ICD-10-CM

## 2019-10-09 DIAGNOSIS — L219 Seborrheic dermatitis, unspecified: Secondary | ICD-10-CM

## 2019-10-09 MED ORDER — DESOXIMETASONE 0.25 % EX CREA: TOPICAL_CREAM | CUTANEOUS | 2 refills | Status: DC

## 2019-10-09 MED ORDER — HYDROCORTISONE 2.5 % EX CREA: TOPICAL_CREAM | CUTANEOUS | 3 refills | Status: DC

## 2019-10-09 MED ORDER — MOMETASONE FUROATE 0.1 % EX SOLN: CUTANEOUS | 2 refills | Status: DC

## 2019-10-09 MED ORDER — KETOCONAZOLE 2 % EX CREA: TOPICAL_CREAM | CUTANEOUS | 2 refills | Status: DC

## 2019-10-09 NOTE — Progress Notes (Signed)
   New Patient Visit  Subjective  Krista Morris is a 69 y.o. female who presents for the following: Rash (reocurring rash in groin and under breast x 5 years, has been using nystatin/TMC ointment prn rash), Mass (present for about 4 years, in right groin. No pain or itch assoiciated with this.), and dry spot (present for 5 years, on lower back and right underarm area, itchy).  The following portions of the chart were reviewed this encounter and updated as appropriate:      Review of Systems:  No other skin or systemic complaints except as noted in HPI or Assessment and Plan.  Objective  Well appearing patient in no apparent distress; mood and affect are within normal limits.  A focused examination was performed including Under breasts and legs. Relevant physical exam findings are noted in the Assessment and Plan.  Objective  Right lower ingunial crease:  1.3 cm X 1.0 cm Subcutaneous firm nodule.   Objective  Right Inframammary Fold: Well demarcated violaceous/hyperpigmented patches with mild scale R lateral inframammary fold  Objective  Right Ankle Anterior, Right Posterior  Axilla, Right spinal lower back: Hyperpigmented scaly coin shaped patches right spinal lower back and right post axilla Hyperpigmented patch mild scale R ankle  (h/o injury in that area in past)  Objective  B/L ear: Hyperpigmented patches with scale BL ear canals. Itches per patient.   Assessment & Plan  Epidermal inclusion cyst Right lower ingunial crease  Reassured benign growth.  Recommend observation.  Discussed surgical excision in office if changes noted or symptomatic.   Erythema intertrigo Right Inframammary Fold  Reviewed chronic nature.   D/C  Nystatin/TMC ointment- discussed risk of skin atrophy in body folds with chronic use  Start ketoconazole 2% cream  Start Hydrocortisone cream 2.5 %   Mix hydrocortisone with ketaconazole 2% twice a day. If improved, decrease to hydrocortisone  and ketaconazole mixed once a day. If still clear, decrease to ketaconazole only.  Recommend Zeasorb AF powder daily prn    hydrocortisone 2.5 % cream - Right Inframammary Fold  ketoconazole (NIZORAL) 2 % cream - Right Inframammary Fold  Nummular dermatitis (3) Right Ankle Anterior; Right Posterior  Axilla; Right spinal lower back  Reviewed chronic nature.  Dry skin care necessary to prevent. Recommend mild soap and moisturizing cream 1-2 times daily.    Start desoximetasone twice daily to affected area on body until itchy rash cleared, Avoid applying to face, groin, and axilla.     Ordered Medications: desoximetasone (TOPICORT) 0.25 % cream  Seborrheic dermatitis B/L ear  Reviewed chronic nature.   Start Mometasone lotion, apply to affected area in ears twice daily prn itch, can use in place of the Fluocinolone oil  May continue Fluocinolone oil as prescribed when not flared.    mometasone (ELOCON) 0.1 % lotion - B/L ear  Return for 6 to 8 weeks Dermatitis.  Allen Norris, CMA, am acting as scribe for Willeen Niece, MD .  Documentation: I have reviewed the above documentation for accuracy and completeness, and I agree with the above.  Willeen Niece MD

## 2019-10-09 NOTE — Progress Notes (Deleted)
   New Patient Visit  Subjective  Krista Morris is a 69 y.o. female who presents for the following: Rash (Reocurring rash under breasts and in groin x 5 years, has been using Nystatin/TMC ointment on and off. ), Mass (Right groin area, x 4 years, no pain or itch associated with. GYN thought it was a hair bump. ), and dry spot (on Back and right axilla area for about 5 years, has been using TMC). Patient states that she has a history of Eczema in her ears   The following portions of the chart were reviewed this encounter and updated as appropriate:      Review of Systems:  No other skin or systemic complaints except as noted in HPI or Assessment and Plan.  Objective  Well appearing patient in no apparent distress; mood and affect are within normal limits.  A focused examination was performed including underbreast and groin area. Relevant physical exam findings are noted in the Assessment and Plan.  No skin findings found.   Assessment & Plan   No follow-ups on file.  Allen Norris, CMA, am acting as scribe for Willeen Niece, MD .

## 2019-10-09 NOTE — Patient Instructions (Addendum)
Recommend daily broad spectrum sunscreen SPF 30+ to sun-exposed areas, reapply every 2 hours as needed. Call for new or changing lesions.  Gentle Skin Care Guide  1. Bathe no more than once a day.  2. Avoid bathing in hot water  3. Use a mild soap like Dove, Vanicream, Cetaphil, CeraVe. Can use Lever 2000 or Cetaphil antibacterial soap  4. Use soap only where you need it. On most days, use it under your arms, between your legs, and on your feet. Let the water rinse other areas unless visibly dirty.  5. When you get out of the bath/shower, use a towel to gently blot your skin dry, don't rub it.  6. While your skin is still a little damp, apply a moisturizing cream such as Vanicream, CeraVe, Cetaphil, Eucerin, Sarna lotion or plain Vaseline Jelly. For hands apply Neutrogena [place] Hand Cream or Excipial Hand Cream.  7. Reapply moisturizer any time you start to itch or feel dry.  8. Sometimes using free and clear laundry detergents can be helpful. Fabric softener sheets should be avoided. Downy Free & Gentle liquid, or any liquid fabric softener that is free of dyes and perfumes, it acceptable to use  9. If your doctor has given you prescription creams you may apply moisturizers over them      EARS:  Start Mometasone lotion, apply to affected area in ears twice daily per itch  Continue Fluocinolone oil as prescribed.  NUMMULAR DERMATITIS:  Start desoximetasone twice daily to affected area on body, Avoid applying to face, groin, and axilla. Use as directed. Risk of skin atrophy with long-term use reviewed.    INTERTRIGO:  Start ketoconazole 2% cream  Start Hydrocortisone cream 2.5 %   Mix hydrocortisone with ketaconazole 2% twice a day. If improved, decrease to hydrocortisone and ketaconazole mixed once a day. If still clear, decrease to ketaconazole only.  Recommend Zeasorb powder

## 2019-11-19 ENCOUNTER — Other Ambulatory Visit: Payer: Self-pay

## 2019-11-19 ENCOUNTER — Ambulatory Visit: Payer: Medicare PPO | Admitting: Dermatology

## 2019-11-19 DIAGNOSIS — L281 Prurigo nodularis: Secondary | ICD-10-CM | POA: Diagnosis not present

## 2019-11-19 DIAGNOSIS — L219 Seborrheic dermatitis, unspecified: Secondary | ICD-10-CM | POA: Diagnosis not present

## 2019-11-19 DIAGNOSIS — L304 Erythema intertrigo: Secondary | ICD-10-CM | POA: Diagnosis not present

## 2019-11-19 DIAGNOSIS — L300 Nummular dermatitis: Secondary | ICD-10-CM

## 2019-11-19 NOTE — Progress Notes (Signed)
   Follow-Up Visit   Subjective  Krista Morris is a 69 y.o. female who presents for the following: Follow-up.  Patient presents today for Follow up on OV 10/09/19 for Nummular dermatitis on R. Ankle, R post axilla, R lower spine, using desoximetasone cream bid  and Seborrheic dermatitis B/L ears and mometasone lotion, dermasmooth oil, still bothering patient at this time and Intertrigo R inframammary fold using Ketoconazole cream. Patient states that she is doing better.  The following portions of the chart were reviewed this encounter and updated as appropriate:      Review of Systems:  No other skin or systemic complaints except as noted in HPI or Assessment and Plan.  Objective  Well appearing patient in no apparent distress; mood and affect are within normal limits.  A focused examination was performed including legs, ears, inframamary area, back. Relevant physical exam findings are noted in the Assessment and Plan.  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    Other Related Medications desoximetasone (TOPICORT) 0.25 % cream  Erythema intertrigo Right Inframammary Fold  Clear with PIH Continue Ketoconazole cream Daily as preventative Use Hydrocortisone 2.5% cream qd as needed for  flares  Other Related Medications hydrocortisone 2.5 % cream ketoconazole (NIZORAL) 2 % cream  Seborrheic dermatitis BL Ears  Improving Reviewed chronic nature.   Do not pick off scale Continue Derma Smooth oil 1 to 2 drops twice daily to ears Continue Mometasone sol. 1 to 2 drops in ears once to twice daily as needed for itch, flares  Other Related Medications mometasone (ELOCON) 0.1 % lotion  Return in about 6 months (around 05/20/2020) for Dermatitis.  Allen Norris, CMA, am acting as scribe for Willeen Niece, MD .  Documentation: I have reviewed the above documentation for accuracy and completeness, and I agree with the above.  Willeen Niece MD

## 2019-11-19 NOTE — Patient Instructions (Signed)
Recommend daily broad spectrum sunscreen SPF 30+ to sun-exposed areas, reapply every 2 hours as needed. Call for new or changing lesions.  

## 2020-01-09 ENCOUNTER — Ambulatory Visit: Payer: Medicare PPO | Admitting: Podiatry

## 2020-01-16 ENCOUNTER — Ambulatory Visit: Payer: Medicare PPO | Admitting: Podiatry

## 2020-02-17 ENCOUNTER — Ambulatory Visit: Payer: Medicare PPO | Admitting: Podiatry

## 2020-02-17 ENCOUNTER — Other Ambulatory Visit: Payer: Self-pay

## 2020-02-17 ENCOUNTER — Encounter: Payer: Self-pay | Admitting: Podiatry

## 2020-02-17 VITALS — BP 157/84 | HR 60

## 2020-02-17 DIAGNOSIS — Q828 Other specified congenital malformations of skin: Secondary | ICD-10-CM

## 2020-02-18 NOTE — Progress Notes (Signed)
This patient presents to the office with chief complaint of a painful callus on her left forefoot.  She says she has had previous foot surgery where an in incision was made and skin was removed on the bottom of her left forefoot.  During the healing process a scar has formed with a painful callus at the far  end of the incision.  This callus is very painful walking and wearing her shoes.  She also says she has a callus on toes on both feet but they are not painful.  She presents the office today for an evaluation and treatment of this painful skin lesion.  Vascular  Dorsalis pedis and posterior tibial pulses are palpable  B/L.  Capillary return  WNL.  Temperature gradient is  WNL.  Skin turgor  WNL  Sensorium  Senn Weinstein monofilament wire  WNL. Normal tactile sensation.  Nail Exam  Patient has normal nails with no evidence of bacterial or fungal infection.  Orthopedic  Exam  Muscle tone and muscle strength  WNL.  No limitations of motion feet  B/L.  No crepitus or joint effusion noted.  Hammer toe second toe left foot.  Adducto-varus fifth toes both feet.  Bony prominences are unremarkable.  Skin  No open lesions.  Normal skin texture and turgor. Dry scaly heels  Bilateral.  Scar tissue sub 4th metatarsal left foot.  Porokeratosis left foot.  Debride skin lesion with # 15 blade.  Told patient to use vaseline both feet.  RTC prn.  Gave her gel dispersion sheet.  RTC prn   Helane Gunther DPM

## 2020-05-26 ENCOUNTER — Ambulatory Visit: Payer: Medicare PPO | Admitting: Dermatology

## 2020-07-01 ENCOUNTER — Ambulatory Visit: Payer: Medicare PPO | Admitting: Dermatology

## 2020-07-08 ENCOUNTER — Ambulatory Visit: Payer: Medicare PPO | Admitting: Dermatology

## 2020-07-08 ENCOUNTER — Other Ambulatory Visit: Payer: Self-pay

## 2020-07-08 DIAGNOSIS — L219 Seborrheic dermatitis, unspecified: Secondary | ICD-10-CM | POA: Diagnosis not present

## 2020-07-08 DIAGNOSIS — L300 Nummular dermatitis: Secondary | ICD-10-CM

## 2020-07-08 DIAGNOSIS — L820 Inflamed seborrheic keratosis: Secondary | ICD-10-CM | POA: Diagnosis not present

## 2020-07-08 DIAGNOSIS — L810 Postinflammatory hyperpigmentation: Secondary | ICD-10-CM

## 2020-07-08 DIAGNOSIS — L280 Lichen simplex chronicus: Secondary | ICD-10-CM | POA: Diagnosis not present

## 2020-07-08 MED ORDER — CALCIPOTRIENE-BETAMETH DIPROP 0.005-0.064 % EX SUSP: Freq: Every day | CUTANEOUS | 0 refills | Status: DC

## 2020-07-08 NOTE — Patient Instructions (Addendum)
Cryotherapy Aftercare  . Wash gently with soap and water everyday.   Marland Kitchen Apply Vaseline and Band-Aid daily until healed.    Apply moisturizer daily to ankles, lower leg, and back after getting after the shower.  Sample of Aveeno moisturizing cream given.   Apply desoximetasone 0.25% cream to spinal mid back BID until itching subsides.  Continue Derma Smooth oil 1 to 2 drops twice daily to ears.  Start Taclonex suspension. Use 1-2 drops around ear canal once a day as needed for flares.   Gentle Skin Care Guide  1. Bathe no more than once a day.  2. Avoid bathing in hot water  3. Use a mild soap like Dove, Vanicream, Cetaphil, CeraVe. Can use Lever 2000 or Cetaphil antibacterial soap  4. Use soap only where you need it. On most days, use it under your arms, between your legs, and on your feet. Let the water rinse other areas unless visibly dirty.  5. When you get out of the bath/shower, use a towel to gently blot your skin dry, don't rub it.  6. While your skin is still a little damp, apply a moisturizing cream such as Vanicream, CeraVe, Cetaphil, Eucerin, Sarna lotion or plain Vaseline Jelly. For hands apply Neutrogena [place] Hand Cream or Excipial Hand Cream.  7. Reapply moisturizer any time you start to itch or feel dry.  8. Sometimes using free and clear laundry detergents can be helpful. Fabric softener sheets should be avoided. Downy Free & Gentle liquid, or any liquid fabric softener that is free of dyes and perfumes, it acceptable to use  9. If your doctor has given you prescription creams you may apply moisturizers over them

## 2020-07-08 NOTE — Progress Notes (Signed)
Follow-Up Visit   Subjective  Krista Morris is a 70 y.o. female who presents for the following: Dermatitis (6 mo f/u for nummular dermatitis to right lower leg and right lower spine. Pt using Desoximetasone cream BID PRN. States that she has not needed it as much as she used to.) and Seborrheic Dermatitis (F/u seb derm b'l ears. Pt using derma smooth oil 1-2 drops BID to the ears. States that she is not able to use the mometasone solution as it burns when she applies it. ).  Ears continue to be scaly and itch a lot.  Oil helps some temporarily.  Pt c/o bilateral lower legs being rough and itchy, itching to her back, and an itchy bump on her back.   The following portions of the chart were reviewed this encounter and updated as appropriate:      Review of Systems: No other skin or systemic complaints except as noted in HPI or Assessment and Plan.   Objective  Well appearing patient in no apparent distress; mood and affect are within normal limits.  A focused examination was performed including back and lower legs. Relevant physical exam findings are noted in the Assessment and Plan.  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 Pt has. Recommend mild soap and moisturizing cream 1-2 times daily.   Apply moisturizer daily to ankles, lower leg, and back after getting after the shower.  Sample of Aveeno moisturizing cream  given.      Other Related Medications desoximetasone (TOPICORT) 0.25 % cream  Inflamed seborrheic keratosis right spinal mid back  Prior to procedure, discussed risks of blister formation, small wound, skin dyspigmentation, or rare scar following cryotherapy.    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 Start Taclonex suspension. Use 1-2 drops to aas ears once a day as needed for flares.   Seborrheic Dermatitis  -  is a chronic persistent rash characterized by pinkness and scaling most commonly of the mid face but also can occur on the scalp (dandruff), ears; mid chest and mid back. It tends to be exacerbated by stress and cooler weather.  People who have neurologic disease may experience new onset or exacerbation of existing seborrheic dermatitis.  The condition is not curable but treatable and can be controlled.   Topical steroids (such as triamcinolone, fluocinolone, fluocinonide, mometasone, clobetasol, halobetasol, betamethasone, hydrocortisone) can cause thinning and lightening of the skin if they are used for too long in the same area. Your physician has selected the right strength medicine for  your problem and area affected on the body. Please use your medication only as directed by your physician to prevent side effects.    calcipotriene-betamethasone (TACLONEX) external suspension - bilateral ear canals  Other Related Medications mometasone (ELOCON) 0.1 % lotion  Return in about 2 months (around 09/05/2020) for f/u for seb  derm/psoriasis ears.   I, Epifania Gore, CMA, am acting as scribe for Willeen Niece, MD.  Documentation: I have reviewed the above documentation for accuracy and completeness, and I agree with the above.  Willeen Niece MD

## 2020-09-22 ENCOUNTER — Ambulatory Visit: Payer: Medicare PPO | Admitting: Dermatology

## 2020-09-22 ENCOUNTER — Other Ambulatory Visit: Payer: Self-pay

## 2020-09-22 DIAGNOSIS — L810 Postinflammatory hyperpigmentation: Secondary | ICD-10-CM | POA: Diagnosis not present

## 2020-09-22 DIAGNOSIS — L219 Seborrheic dermatitis, unspecified: Secondary | ICD-10-CM

## 2020-09-22 DIAGNOSIS — L300 Nummular dermatitis: Secondary | ICD-10-CM

## 2020-09-22 NOTE — Progress Notes (Signed)
   Follow-Up Visit   Subjective  Krista Morris is a 70 y.o. female who presents for the following: Follow-up (Patient here for 2 month follow-up seb derm vs psoriasis of the ears. 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 Improved with desoximetasone 0.25% cream. ).   The following portions of the chart were reviewed this encounter and updated as appropriate:       Review of Systems:  No other skin or systemic complaints except as noted in HPI or Assessment and Plan.  Objective  Well appearing patient in no apparent distress; mood and affect are within normal limits.  A focused examination was performed including face, ears, leg, back. Relevant physical exam findings are noted in the Assessment and Plan.  Objective  Ears: Ears clear today.  Objective  Right Lower Leg, lower back: Hyperpigmented patch on lower back. Right lower leg clear.   Assessment & Plan  Seborrheic dermatitis Ears  vs Psoriasis, ears  Improved. Controlled at this time Discussed chronic nature.  Continue Taclonex suspension qod prn flares. Pt will call for refills. Caution atrophy with long-term use.   Continue Derma-Smoothe oil prn itch, better for maintenance treatment. Pt will call for refills.  Seborrheic Dermatitis  -  is a chronic persistent rash characterized by pinkness and scaling most commonly of the mid face but also can occur on the scalp (dandruff), ears; mid chest and mid back. It tends to be exacerbated by stress and cooler weather.  People who have neurologic disease may experience new onset or exacerbation of existing seborrheic dermatitis.  The condition is not curable but treatable and can be controlled.   .  Other Related Medications calcipotriene-betamethasone (TACLONEX) external suspension  Nummular dermatitis Right Lower Leg, lower back  With  PIH, improved  Continue desoximetasone 0.25% cream qd/bid prn itch/flares.  Recommend mild soap and moisturizing cream 1-2 times daily.  Gentle skin care handout provided.     Other Related Medications desoximetasone (TOPICORT) 0.25 % cream  Return if symptoms worsen or fail to improve.   ICherlyn Labella, CMA, am acting as scribe for Willeen Niece, MD .  Documentation: I have reviewed the above documentation for accuracy and completeness, and I agree with the above.  Willeen Niece MD

## 2020-09-22 NOTE — Patient Instructions (Signed)

## 2020-10-02 ENCOUNTER — Other Ambulatory Visit: Payer: Self-pay | Admitting: Family Medicine

## 2020-10-02 DIAGNOSIS — Z1231 Encounter for screening mammogram for malignant neoplasm of breast: Secondary | ICD-10-CM

## 2020-10-07 ENCOUNTER — Ambulatory Visit
Admission: RE | Admit: 2020-10-07 | Discharge: 2020-10-07 | Disposition: A | Discharge status: Home / Self Care | Payer: Medicare PPO | Source: Ambulatory Visit | Attending: Family Medicine | Admitting: Family Medicine

## 2020-10-07 ENCOUNTER — Other Ambulatory Visit: Payer: Self-pay

## 2020-10-07 DIAGNOSIS — Z1231 Encounter for screening mammogram for malignant neoplasm of breast: Secondary | ICD-10-CM | POA: Diagnosis present

## 2020-11-16 ENCOUNTER — Other Ambulatory Visit: Payer: Self-pay | Admitting: Dermatology

## 2020-11-16 DIAGNOSIS — L304 Erythema intertrigo: Secondary | ICD-10-CM

## 2021-01-28 ENCOUNTER — Encounter: Payer: Self-pay | Admitting: Otolaryngology

## 2021-02-04 ENCOUNTER — Other Ambulatory Visit: Payer: Self-pay

## 2021-02-04 ENCOUNTER — Emergency Department: Payer: Medicare PPO

## 2021-02-04 ENCOUNTER — Emergency Department
Admission: EM | Admit: 2021-02-04 | Discharge: 2021-02-04 | Disposition: A | Discharge status: Home / Self Care | Payer: Medicare PPO | Attending: Emergency Medicine | Admitting: Emergency Medicine

## 2021-02-04 DIAGNOSIS — Z7982 Long term (current) use of aspirin: Secondary | ICD-10-CM | POA: Insufficient documentation

## 2021-02-04 DIAGNOSIS — M7122 Synovial cyst of popliteal space [Baker], left knee: Secondary | ICD-10-CM | POA: Diagnosis not present

## 2021-02-04 DIAGNOSIS — M79605 Pain in left leg: Secondary | ICD-10-CM | POA: Diagnosis present

## 2021-02-04 DIAGNOSIS — I10 Essential (primary) hypertension: Secondary | ICD-10-CM | POA: Insufficient documentation

## 2021-02-04 DIAGNOSIS — Z79899 Other long term (current) drug therapy: Secondary | ICD-10-CM | POA: Insufficient documentation

## 2021-02-04 NOTE — ED Provider Notes (Signed)
[facility] Emergency Department Provider Note ____________________________________________   Event Date/Time   First MD Initiated Contact with Patient 02/04/21 2027     (approximate)  I have reviewed the triage vital signs and the nursing notes.  HISTORY  Chief Complaint Leg Pain   HPI Krista Morris is a 70 y.o. femalewho presents to the ED for evaluation of left posterior leg pain  Chart review indicates obese patient with history of hypertension.  Patient reports 2 weeks of persistent posterior left leg pain, to her proximal left calf and popliteal fossa.  She reports no discrete trauma, but does report a flight to and from [facility] about 2 weeks ago, her symptoms started while she was there.  She reports improvement of her pain with naproxen.  Denies any chest pain, shortness of breath, trauma, falls or injuries.  Denies weakness to the leg, but reports a limping gait due to pain.   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   Arthritis 11/20/2014   Hemorrhoid 11/20/2014   BP (high blood pressure) 11/20/2014    Past Surgical History:  Procedure Laterality Date   bone spur removal  2012   from foot   CESAREAN SECTION  1982, 1985   Dr Francoise Schaumann   COLONOSCOPY  2004, 2015   hemorrhoids in 2004 and in 2015 normal screening   ENDOMETRIAL BIOPSY  2000, 2002   disordered proliferative; weakly proliferative   PRE-MALIGNANT / BENIGN SKIN LESION EXCISION     right thigh; ulcerated fibroepithelial polyp   ROTATOR CUFF REPAIR Right 2009   SALPINGECTOMY Right 1990   ectopic    TONSILLECTOMY     TUBAL LIGATION      Prior to Admission medications   Medication Sig Start Date End Date Taking? Authorizing Provider  amLODipine (NORVASC) 10 MG tablet Take 10 mg by mouth daily.  05/01/17   [provider]  aspirin 81 MG EC tablet Take by mouth.    [provider]  calcipotriene-betamethasone [facility]) external suspension Apply topically daily. 07/08/20   Willeen Niece, MD  cetirizine (ZYRTEC) 10 MG tablet Take 10 mg by mouth at bedtime.    [provider]  Cholecalciferol (VITAMIN D3) 1000 UNITS CAPS Take by mouth.    [provider]  clotrimazole-betamethasone (LOTRISONE) cream Apply 1 application topically 2 (two) times daily. 05/18/17   Farrel Conners, CNM  desoximetasone (TOPICORT) 0.25 % cream Apply to affected areas on body twice daily as needed for itch,Avoid applying to face, groin, and axilla 10/09/19   Willeen Niece, MD  Fluocinolone Acetonide 0.01 % OIL Place 2 drops into both ears daily. 01/14/19   [provider]  hydrochlorothiazide (HYDRODIURIL) 12.5 MG tablet Take 12.5 mg by mouth daily.    [provider]  hydrocortisone 2.5 % cream MIX HYDROCORTISONE WITH KETACONAZOLE 2% TWICE A DAY. IF IMPROVED, DECREASE TO HYDROCORTISONE AND KETACONAZOLE MIXED ONCE A DAY. IF STILL CLEAR, DECREASE TO KETACONAZOLE ONLY 11/16/20   Willeen Niece, MD  ipratropium (ATROVENT) 0.03 % nasal spray Place 2 sprays into the nose as needed. 01/14/19   [provider]  ketoconazole (NIZORAL) 2 % cream MIX HYDROCORTISONE WITH KETACONAZOLE 2% TWICE A DAY. IF IMPROVED, DECREASE TO HYDROCORTISONE AND KETACONAZOLE MIXED ONCE A DAY. IF STILL CLEAR, DECREASE  TO KETACONAZOLE ONLY 11/16/20   Willeen Niece, MD  losartan (COZAAR) 50 MG tablet Take 50 mg by mouth daily. 02/20/17   [provider]  metoprolol tartrate (LOPRESSOR) 50 MG tablet TAKE 1 AND 1/2 TABLETS BY MOUTH TWICE A DAY 10/24/16   [provider]  nystatin-triamcinolone ointment (MYCOLOG) APPLY TO AFFECTED AREA 2 -3 TIMES DAILY 01/10/19   Farrel Conners, CNM    Allergies Metronidazole  Family History  Problem Relation Age of Onset   Hypertension  Mother    Diabetes Father        died from complications of DM   Hypertension Sister    Hypertension Brother    Breast cancer Cousin        paternal x2, age 36 and age 44    Social History Social History   Tobacco Use   Smoking status: Never   Smokeless tobacco: Never  Vaping Use   Vaping Use: Never used  Substance Use Topics   Alcohol use: No    Alcohol/week: 0.0 standard drinks   Drug use: No    Review of Systems  Constitutional: No fever/chills Eyes: No visual changes. ENT: No sore throat. Cardiovascular: Denies chest pain. Respiratory: Denies shortness of breath. Gastrointestinal: No abdominal pain.  No nausea, no vomiting.  No diarrhea.  No constipation. Genitourinary: Negative for dysuria. Musculoskeletal: Negative for back pain. Positive atraumatic left leg pain. Skin: Negative for rash. Neurological: Negative for headaches, focal weakness or numbness.  ____________________________________________   PHYSICAL EXAM:  VITAL SIGNS: Vitals:   02/04/21 1824 02/04/21 2105  BP: (!) 169/83 (!) 159/88  Pulse: 64 70  Resp: 16 17  Temp: 98 F (36.7 C)   SpO2: 100% 98%     Constitutional: Alert and oriented. Well appearing and in no acute distress. Eyes: Conjunctivae are normal. PERRL. EOMI. Head: Atraumatic. Nose: No congestion/rhinnorhea. Mouth/Throat: Mucous membranes are moist.  Oropharynx non-erythematous. Neck: No stridor. No cervical spine tenderness to palpation. Cardiovascular: Normal rate, regular rhythm. Grossly normal heart sounds.  Good peripheral circulation. Respiratory: Normal respiratory effort.  No retractions. Lungs CTAB. Gastrointestinal: Soft , nondistended, nontender to palpation. No CVA tenderness. Musculoskeletal:  No joint effusions. No signs of acute trauma. Some mild tenderness to the proximal left calf without overlying skin changes or signs of trauma. No swelling to either leg or pitting edema. Does have a palpable subcutaneous  soft tissue mass to her left popliteal fossa consistent with a Baker's cyst.  The left knee does not have an effusion anteriorly or laterally and has no anterior tenderness throughout the leg.  Left leg is distally neurovascularly intact. Neurologic:  Normal speech and language. No gross focal neurologic deficits are appreciated. No gait instability noted. Skin:  Skin is warm, dry and intact. No rash noted. Psychiatric: Mood and affect are normal. Speech and behavior are normal.  ____________________________________________   LABS (all labs ordered are listed, but only abnormal results are displayed)  Labs Reviewed - No data to display ____________________________________________  12 Lead EKG   ____________________________________________  RADIOLOGY  ED MD interpretation:    Official radiology report(s): US Venous Img Lower Unilateral Left  Result Date: 02/04/2021 CLINICAL DATA:  Possible DVT. EXAM: LEFT LOWER EXTREMITY VENOUS DOPPLER ULTRASOUND TECHNIQUE: Gray-scale sonography with compression, as well as color and duplex ultrasound, were performed to evaluate the deep venous system(s) from the level of the common femoral vein through the popliteal and proximal calf veins. COMPARISON:  None. FINDINGS: VENOUS Normal compressibility of the common femoral, superficial  femoral, and popliteal veins, as well as the visualized calf veins. Visualized portions of profunda femoral vein and great saphenous vein unremarkable. No filling defects to suggest DVT on grayscale or color Doppler imaging. Doppler waveforms show normal direction of venous flow, normal respiratory plasticity and response to augmentation. Limited views of the contralateral common femoral vein are unremarkable. IMPRESSION: No evidence of DVT in the left lower extremity. Electronically Signed   By: Feliberto Harts M.D.   On: 02/04/2021 18:55    ____________________________________________   PROCEDURES and  INTERVENTIONS  Procedure(s) performed (including Critical Care):  Procedures  Medications - No data to display  ____________________________________________   MDM / ED COURSE   Pleasant 70 year old woman presents with subacute left leg pain, consistent with a Baker's cyst, and amenable to outpatient management and orthopedic follow-up.  Clinically well-appearing without evidence of neurologic or vascular deficits.  No swelling or signs of cellulitis or DVT externally.  Does have some tenderness to her left calf that may be related to muscular spasm, but is most tender to her left popliteal fossa that has a palpable soft tissue mass consistent with a Baker's cyst.  No signs of knee pathology anteriorly.  No barriers to outpatient management.  We will discharge with information for orthopedic follow-up.     ____________________________________________   FINAL CLINICAL IMPRESSION(S) / ED DIAGNOSES  Final diagnoses:  Left leg pain  Baker's cyst of knee, left     ED Discharge Orders     None        Dontrel Smethers   Note:  This document was prepared using Dragon voice recognition software and may include unintentional dictation errors.    Delton [place], MD 02/04/21 2109

## 2021-02-04 NOTE — ED Triage Notes (Signed)
Pt to ER via [facility] with complaints of left calf swelling and pain x1.5 weeks, reports recent aeroplane travel to [facility]. Denies SHOB or CP.   2+ pedal pulses in triage.

## 2021-02-04 NOTE — Discharge Instructions (Signed)
Use Tylenol for pain and fevers.  Up to 1000 mg per dose, up to 4 times per day.  Do not take more than 4000 mg of Tylenol/acetaminophen within 24 hours..  Use naproxen/Aleve for anti-inflammatory pain relief. Use up to 500mg every 12 hours. Do not take more frequently than this. Do not use other NSAIDs (ibuprofen, Advil) while taking this medication. It is safe to take Tylenol with this.   

## 2021-09-09 ENCOUNTER — Other Ambulatory Visit: Payer: Self-pay | Admitting: Family Medicine

## 2021-09-09 DIAGNOSIS — Z1231 Encounter for screening mammogram for malignant neoplasm of breast: Secondary | ICD-10-CM

## 2021-09-20 ENCOUNTER — Other Ambulatory Visit: Payer: Self-pay | Admitting: Dermatology

## 2021-09-20 DIAGNOSIS — L300 Nummular dermatitis: Secondary | ICD-10-CM

## 2021-09-20 DIAGNOSIS — L219 Seborrheic dermatitis, unspecified: Secondary | ICD-10-CM

## 2021-09-25 ENCOUNTER — Other Ambulatory Visit: Payer: Self-pay | Admitting: Dermatology

## 2021-09-25 DIAGNOSIS — L219 Seborrheic dermatitis, unspecified: Secondary | ICD-10-CM

## 2021-10-11 ENCOUNTER — Ambulatory Visit
Admission: RE | Admit: 2021-10-11 | Discharge: 2021-10-11 | Disposition: A | Discharge status: Home / Self Care | Payer: Medicare PPO | Source: Ambulatory Visit | Attending: Family Medicine | Admitting: Family Medicine

## 2021-10-11 DIAGNOSIS — Z1231 Encounter for screening mammogram for malignant neoplasm of breast: Secondary | ICD-10-CM | POA: Insufficient documentation

## 2021-10-14 ENCOUNTER — Other Ambulatory Visit: Payer: Self-pay | Admitting: Family Medicine

## 2021-10-14 ENCOUNTER — Other Ambulatory Visit: Payer: Self-pay | Admitting: Interventional Cardiology

## 2021-10-14 DIAGNOSIS — R928 Other abnormal and inconclusive findings on diagnostic imaging of breast: Secondary | ICD-10-CM

## 2021-10-14 DIAGNOSIS — N6489 Other specified disorders of breast: Secondary | ICD-10-CM

## 2021-11-01 ENCOUNTER — Ambulatory Visit
Admission: RE | Admit: 2021-11-01 | Discharge: 2021-11-01 | Disposition: A | Discharge status: Home / Self Care | Payer: Medicare PPO | Source: Ambulatory Visit | Attending: Family Medicine | Admitting: Family Medicine

## 2021-11-01 DIAGNOSIS — N6489 Other specified disorders of breast: Secondary | ICD-10-CM | POA: Diagnosis present

## 2021-11-01 DIAGNOSIS — R928 Other abnormal and inconclusive findings on diagnostic imaging of breast: Secondary | ICD-10-CM | POA: Insufficient documentation

## 2021-11-05 ENCOUNTER — Other Ambulatory Visit: Payer: Self-pay | Admitting: Dermatology

## 2021-11-05 DIAGNOSIS — L219 Seborrheic dermatitis, unspecified: Secondary | ICD-10-CM

## 2021-11-05 DIAGNOSIS — L300 Nummular dermatitis: Secondary | ICD-10-CM

## 2021-11-15 ENCOUNTER — Other Ambulatory Visit: Payer: Self-pay | Admitting: Dermatology

## 2021-11-15 DIAGNOSIS — L219 Seborrheic dermatitis, unspecified: Secondary | ICD-10-CM

## 2021-11-21 ENCOUNTER — Other Ambulatory Visit: Payer: Self-pay | Admitting: Dermatology

## 2021-11-21 DIAGNOSIS — L219 Seborrheic dermatitis, unspecified: Secondary | ICD-10-CM

## 2021-12-04 ENCOUNTER — Other Ambulatory Visit: Payer: Self-pay | Admitting: Dermatology

## 2021-12-04 DIAGNOSIS — L219 Seborrheic dermatitis, unspecified: Secondary | ICD-10-CM

## 2022-05-10 ENCOUNTER — Other Ambulatory Visit: Payer: Self-pay | Admitting: Dermatology

## 2022-05-10 DIAGNOSIS — L304 Erythema intertrigo: Secondary | ICD-10-CM

## 2022-05-10 NOTE — Telephone Encounter (Signed)
Patient needs appointment for further refills

## 2022-05-23 DIAGNOSIS — E119 Type 2 diabetes mellitus without complications: Secondary | ICD-10-CM

## 2022-05-23 HISTORY — DX: Type 2 diabetes mellitus without complications: E11.9

## 2022-06-09 ENCOUNTER — Other Ambulatory Visit: Payer: Self-pay

## 2022-06-09 DIAGNOSIS — L300 Nummular dermatitis: Secondary | ICD-10-CM

## 2022-06-09 MED ORDER — DESOXIMETASONE 0.25 % EX CREA: TOPICAL_CREAM | CUTANEOUS | 0 refills | Status: DC

## 2022-08-23 ENCOUNTER — Ambulatory Visit: Payer: Medicare PPO | Admitting: Dermatology

## 2022-08-23 VITALS — BP 136/88 | HR 72

## 2022-08-23 DIAGNOSIS — L810 Postinflammatory hyperpigmentation: Secondary | ICD-10-CM

## 2022-08-23 DIAGNOSIS — L304 Erythema intertrigo: Secondary | ICD-10-CM

## 2022-08-23 DIAGNOSIS — L300 Nummular dermatitis: Secondary | ICD-10-CM

## 2022-08-23 DIAGNOSIS — L219 Seborrheic dermatitis, unspecified: Secondary | ICD-10-CM

## 2022-08-23 MED ORDER — CALCIPOTRIENE-BETAMETH DIPROP 0.005-0.064 % EX SUSP: CUTANEOUS | 1 refills | Status: DC

## 2022-08-23 MED ORDER — FLUOCINOLONE ACETONIDE 0.01 % OT OIL: TOPICAL_OIL | OTIC | 11 refills | Status: DC

## 2022-08-23 MED ORDER — DESOXIMETASONE 0.25 % EX CREA: TOPICAL_CREAM | CUTANEOUS | 1 refills | Status: DC

## 2022-08-23 MED ORDER — KETOCONAZOLE 2 % EX CREA: TOPICAL_CREAM | CUTANEOUS | 6 refills | Status: DC

## 2022-08-23 MED ORDER — HYDROCORTISONE 2.5 % EX CREA: TOPICAL_CREAM | CUTANEOUS | 3 refills | Status: DC

## 2022-08-23 NOTE — Patient Instructions (Addendum)
Recommend OTC Zeasorb AF powder to body folds daily after shower.  It is often found in the athlete's foot section in the pharmacy.  Avoid using powders that contain cornstarch.     Due to recent changes in healthcare laws, you may see results of your pathology and/or laboratory studies on MyChart before the doctors have had a chance to review them. We understand that in some cases there may be results that are confusing or concerning to you. Please understand that not all results are received at the same time and often the doctors may need to interpret multiple results in order to provide you with the best plan of care or course of treatment. Therefore, we ask that you please give [place] 2 business days to thoroughly review all your results before contacting the office for clarification. Should we see a critical lab result, you will be contacted sooner.   If You Need Anything After Your Visit  If you have any questions or concerns for your doctor, please call our main line at [phone] and press option 4 to reach your doctor's medical assistant. If no one answers, please leave a voicemail as directed and we will return your call as soon as possible. Messages left after 4 pm will be answered the following business day.   You may also send [place] a message via [place]. We typically respond to MyChart messages within 1-2 business days.  For prescription refills, please ask your pharmacy to contact our office. Our fax number is [phone].  If you have an urgent issue when the clinic is closed that cannot wait until the next business day, you can page your doctor at the number below.    Please note that while we do our best to be available for urgent issues outside of office hours, we are not available 24/7.   If you have an urgent issue and are unable to reach [place], you may choose to seek medical care at your doctor's office, retail clinic, urgent care center, or emergency room.  If you have a medical  emergency, please immediately call 911 or go to the emergency department.  Pager Numbers  - Dr. Nehemiah Massed: [phone]  - Dr. Laurence Ferrari: [phone]  - Dr. Nicole Kindred: [phone]  In the event of inclement weather, please call our main line at [phone] for an update on the status of any delays or closures.  Dermatology Medication Tips: Please keep the boxes that topical medications come in in order to help keep track of the instructions about where and how to use these. Pharmacies typically print the medication instructions only on the boxes and not directly on the medication tubes.   If your medication is too expensive, please contact our office at [phone] option 4 or send [place] a message through [place].   We are unable to tell what your co-pay for medications will be in advance as this is different depending on your insurance coverage. However, we may be able to find a substitute medication at lower cost or fill out paperwork to get insurance to cover a needed medication.   If a prior authorization is required to get your medication covered by your insurance company, please allow [place] 1-2 business days to complete this process.  Drug prices often vary depending on where the prescription is filled and some pharmacies may offer cheaper prices.  The website www.goodrx.com contains coupons for medications through different pharmacies. The prices here do not account for what the cost may be with help from  insurance (it may be cheaper with your insurance), but the website can give you the price if you did not use any insurance.  - You can print the associated coupon and take it with your prescription to the pharmacy.  - You may also stop by our office during regular business hours and pick up a GoodRx coupon card.  - If you need your prescription sent electronically to a different pharmacy, notify our office through [facility] or by phone at [phone] option 4.     Si Usted  Necesita Algo Despus de Su Visita  Tambin puede enviarnos un mensaje a travs de Pharmacist, community. Por lo general respondemos a los mensajes de MyChart en el transcurso de 1 a 2 das hbiles.  Para renovar recetas, por favor pida a su farmacia que se ponga en contacto con nuestra oficina. Harland Dingwall de fax es [place] [phone].  Si tiene un asunto urgente cuando la clnica est cerrada y que no puede esperar hasta el siguiente da hbil, puede llamar/localizar a su doctor(a) al nmero que aparece a continuacin.   Por favor, tenga en cuenta que aunque hacemos todo lo posible para estar disponibles para asuntos urgentes fuera del horario de [place], no estamos disponibles las 24 horas del da, los 7 das de la [place].   Si tiene un problema urgente y no puede comunicarse con nosotros, puede optar por buscar atencin mdica  en el consultorio de su doctor(a), en una clnica privada, en un centro de atencin urgente o en una sala de emergencias.  Si tiene Engineering geologist, por favor llame inmediatamente al 911 o vaya a la sala de emergencias.  Nmeros de bper  - Dr. Nehemiah Massed: [phone]  - Dra. Moye: [phone]  - Dra. Nicole Kindred: [phone]  En caso de inclemencias del [place], por favor llame a Johnsie Kindred principal al [phone] para una actualizacin sobre el [place] de cualquier retraso o cierre.  Consejos para la medicacin en dermatologa: Por favor, guarde las cajas en las que vienen los medicamentos de uso tpico para ayudarle a seguir las instrucciones sobre dnde y cmo usarlos. Las farmacias generalmente imprimen las instrucciones del medicamento slo en las cajas y no directamente en los tubos del [place].   Si su medicamento es muy caro, por favor, pngase en contacto con Zigmund Daniel llamando al [phone] y presione la opcin 4 o envenos un mensaje a travs de Pharmacist, community.   No podemos decirle cul ser su copago por los medicamentos por adelantado ya que esto es  diferente dependiendo de la cobertura de su seguro. Sin embargo, es posible que podamos encontrar un medicamento sustituto a Electrical engineer un formulario para que el seguro cubra el medicamento que se considera necesario.   Si se requiere una autorizacin previa para que su compaa de seguros [place] su medicamento, por favor permtanos de 1 a 2 das hbiles para completar este proceso.  Los precios de los medicamentos varan con frecuencia dependiendo del Environmental consultant de dnde se surte la receta y alguna farmacias pueden ofrecer precios ms baratos.  El sitio web www.goodrx.com tiene cupones para medicamentos de Airline pilot. Los precios aqu no tienen en cuenta lo que podra costar con la ayuda del seguro (puede ser ms barato con su seguro), pero el sitio web puede darle el precio si no utiliz Research scientist (physical sciences).  - Puede imprimir el cupn correspondiente y llevarlo con su receta a la farmacia.  - Tambin puede pasar por nuestra oficina durante el horario de  atencin regular y recoger una tarjeta de cupones de GoodRx.  - Si necesita que su receta se enve electrnicamente a una farmacia diferente, informe a nuestra oficina a travs de MyChart de [place] o por telfono llamando al [phone] y presione la opcin 4.  

## 2022-08-23 NOTE — Progress Notes (Signed)
   Follow-Up Visit   Subjective  Krista Morris is a 72 y.o. 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 Improved with desoximetasone 0.25% cream prn).    The following portions of the chart were reviewed this encounter and updated as appropriate: medications, allergies, medical history  Review of Systems:  No other skin or systemic complaints except as noted in HPI or Assessment and Plan.  Objective  Well appearing patient in no apparent distress; mood and affect are within normal limits.  A focused examination was performed of the following areas: face, arms, bilateral legs    Relevant exam findings are noted in the Assessment and Plan.    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 Condition is symptomatic/ bothersome to patient. Not currently at goal. Has been out of Taclonex suspension   Seborrheic Dermatitis -  is a chronic persistent rash characterized by pinkness and scaling most commonly of the mid face but also can occur on the scalp (dandruff), ears; mid chest, mid back and groin.  It tends to be exacerbated by stress and cooler weather.  People who have neurologic disease may experience new onset or exacerbation of existing seborrheic dermatitis.  The condition is not curable but treatable and can be controlled.    restart Taclonex suspension qod prn flares. Pt will call for refills. Caution atrophy with long-term use.    Continue Dermotic oil prn itch, better for maintenance treatment. Pt will call for refills.    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 Condition is symptomatic/ bothersome to patient. Improved but not currently at goal.    Nummular dermatitis (eczema) is a chronic, relapsing, pruritic condition that can significantly affect quality of life. It is often associated with allergic rhinitis and/or asthma and can require treatment with topical medications, phototherapy, or in severe cases biologic injectable medication (Dupixent; Adbry) or Oral JAK inhibitors.    restart desoximetasone 0.25% cream qd/bid prn itch/flares.   Recommend mild soap and moisturizing cream 1-2 times daily.  Gentle skin care handout provided.    Erythema intertrigo Right Inframammary Fold    Intertrigo is a chronic recurrent rash that occurs in skin fold areas that may be associated with friction; heat; moisture; yeast; fungus; and bacteria.  It is exacerbated by increased movement / activity; sweating; and higher atmospheric temperature.     Continue  ketoconazole 2% cream qd/bid as directed Continue  Hydrocortisone cream 2.5 % qd/bid as directed   Mix hydrocortisone with ketaconazole 2% twice a day. If improved, decrease to hydrocortisone and ketaconazole mixed once a day. If still clear, decrease to ketaconazole only.   Recommend Zeasorb AF powder daily prn  Return in about 1 year (around 08/23/2023) for Eczema, Psoriasis .  I, Marye Round, CMA, am acting as scribe for Brendolyn Patty, MD .   Documentation: I have reviewed the above documentation for accuracy and completeness, and I agree with the above.  Brendolyn Patty, MD

## 2022-09-19 ENCOUNTER — Other Ambulatory Visit: Payer: Self-pay | Admitting: Obstetrics and Gynecology

## 2022-09-19 DIAGNOSIS — Z1231 Encounter for screening mammogram for malignant neoplasm of breast: Secondary | ICD-10-CM

## 2022-10-13 ENCOUNTER — Ambulatory Visit
Admission: RE | Admit: 2022-10-13 | Discharge: 2022-10-13 | Disposition: A | Discharge status: Home / Self Care | Payer: Medicare PPO | Source: Ambulatory Visit | Attending: Obstetrics and Gynecology | Admitting: Obstetrics and Gynecology

## 2022-10-13 DIAGNOSIS — Z1231 Encounter for screening mammogram for malignant neoplasm of breast: Secondary | ICD-10-CM | POA: Insufficient documentation

## 2022-12-23 IMAGING — MG MM DIGITAL DIAGNOSTIC UNILAT*L* W/ TOMO W/ CAD
8 series · 8 of 24 positions shown · non-contrast
Comparison: Previous exam(s).

CLINICAL DATA: Screening recall for a left breast asymmetry.

EXAM:
DIGITAL DIAGNOSTIC UNILATERAL LEFT MAMMOGRAM WITH TOMOSYNTHESIS AND
CAD
TECHNIQUE: Left digital diagnostic mammography and breast tomosynthesis was
performed. The images were evaluated with computer-aided detection.

[L MLO synth-2D]
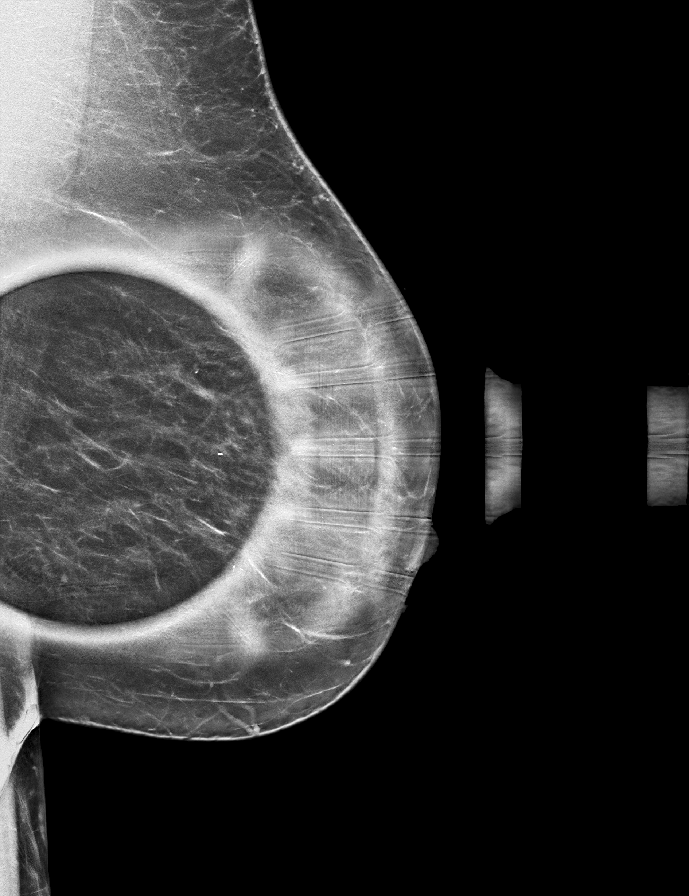

[L CC synth-2D (1 of 2)]
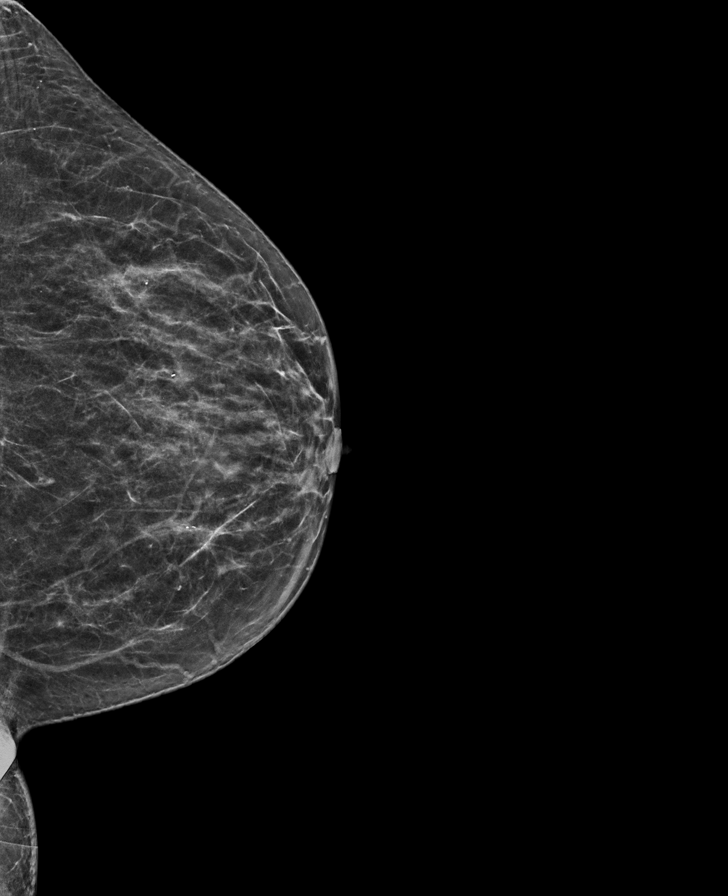

[L CC synth-2D (2 of 2)]
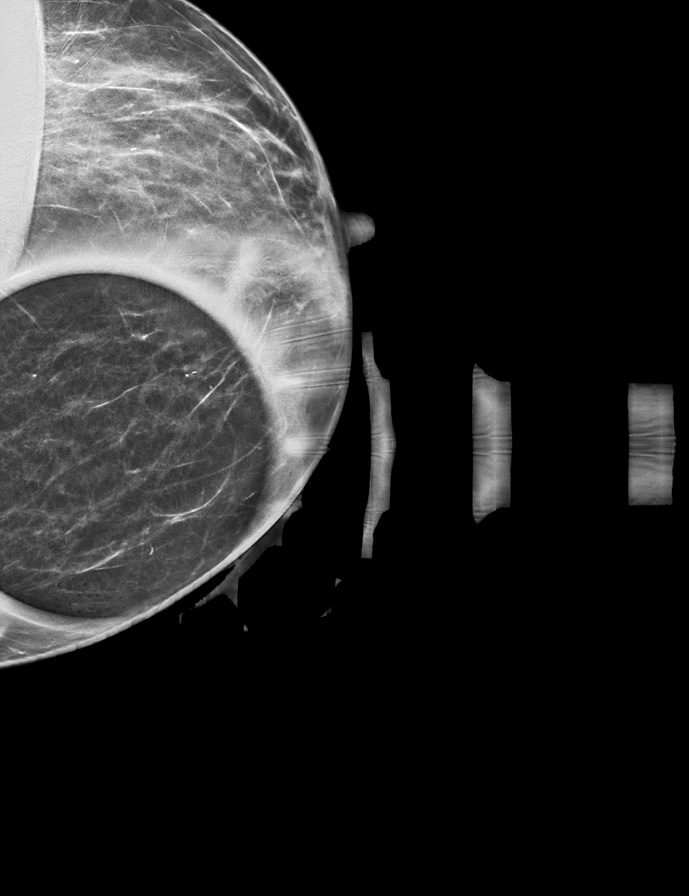

[L ML synth-2D]
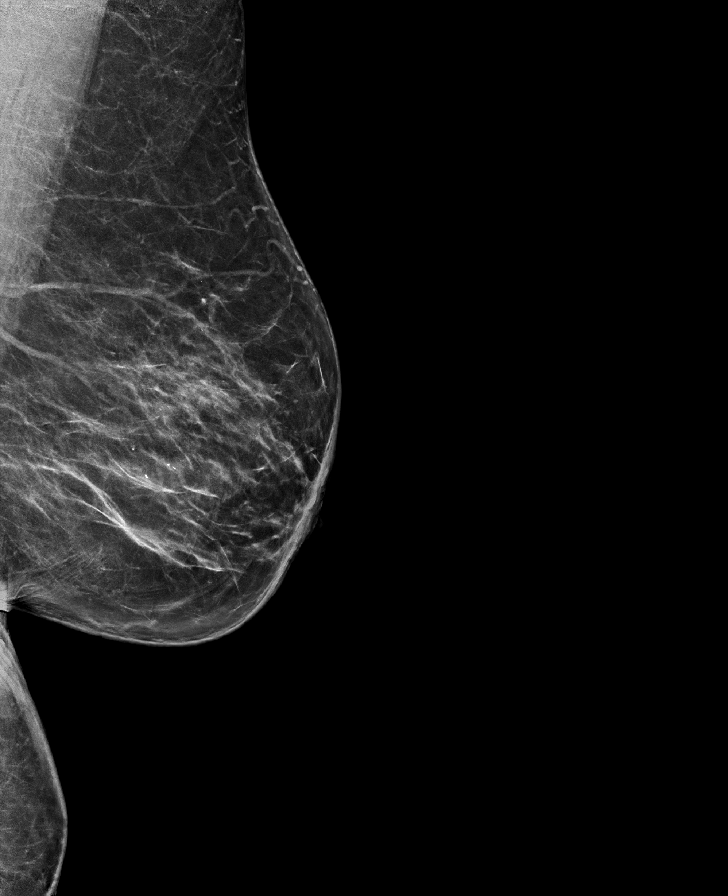

[L ML tomo · tomo slice 38/75.0]
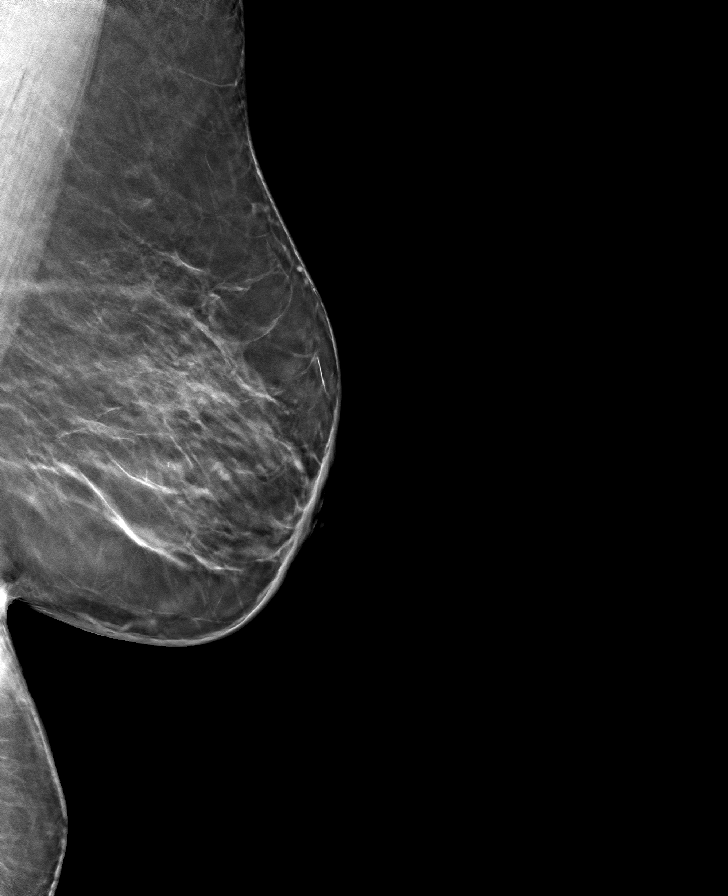

[L MLO tomo · tomo slice 33/64.0]
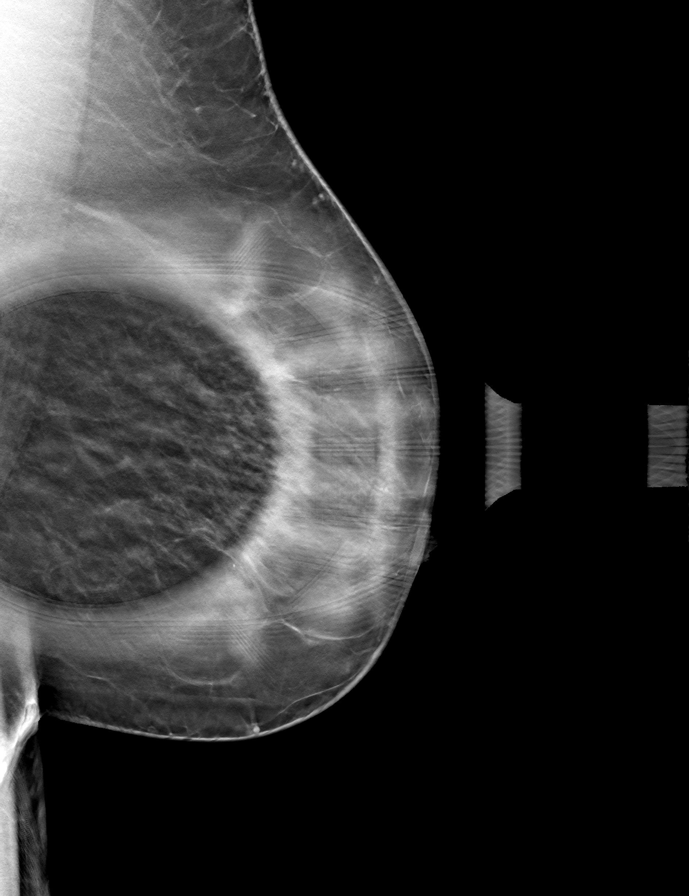

[L CC tomo (1 of 2) · tomo slice 29/58.0]
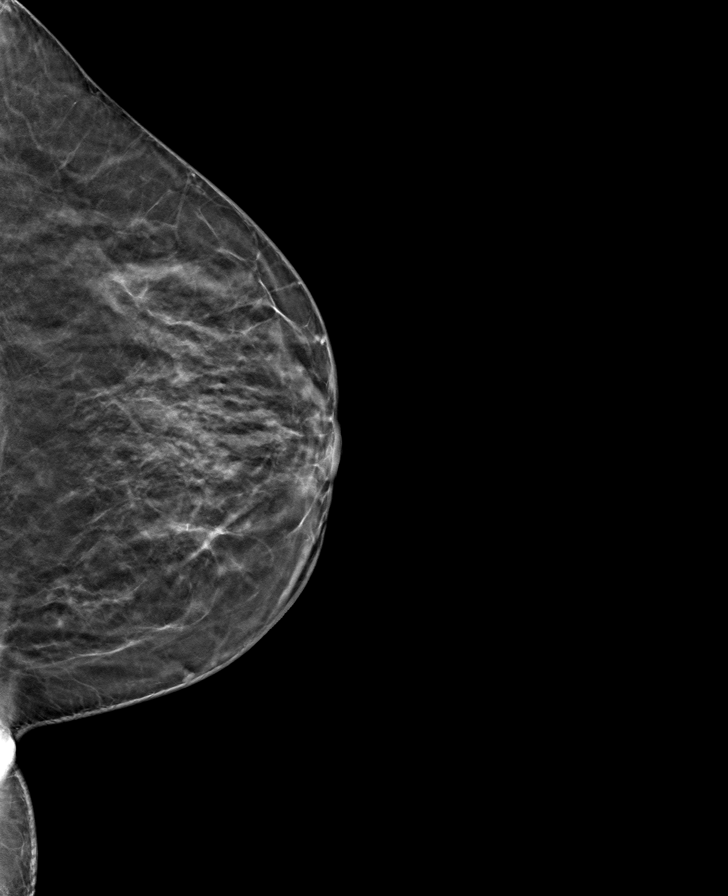

[L CC tomo (2 of 2) · tomo slice 25/48.0]
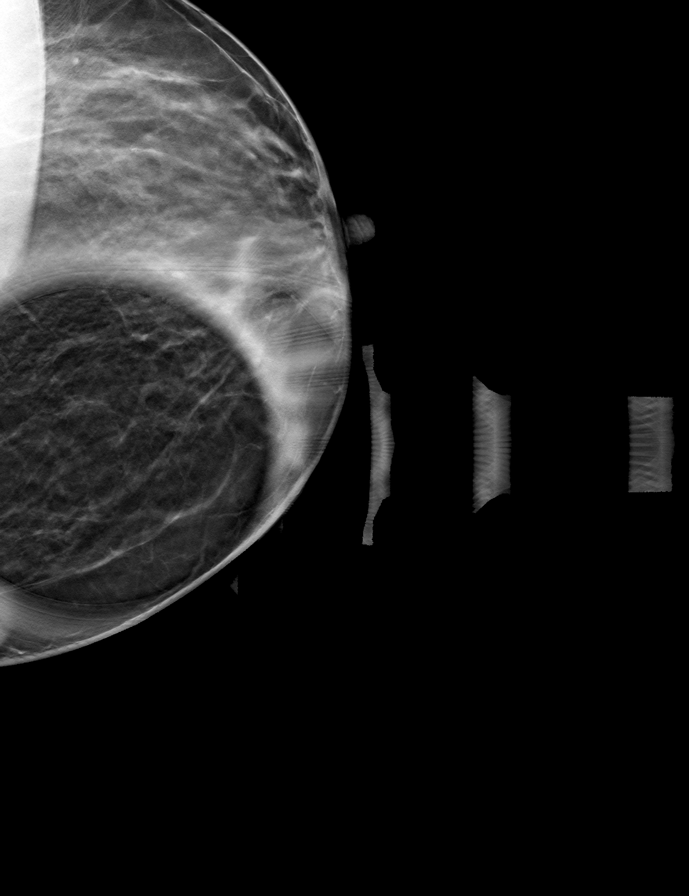

[8 of 24 positions shown; findings below may reference images not displayed]

ACR Breast Density Category b: There are scattered areas of
fibroglandular density.
FINDINGS: The asymmetry in the medial posterior left breast on the CC view of
the screening mammogram resolves with spot compression tomosynthesis
imaging. A repeat full paddle CC image was performed, confirming
resolution this asymmetry. No suspicious calcifications, masses or
areas of distortion are seen in the left breast.
IMPRESSION: Resolution of the left breast asymmetry consistent with overlapping
fibroglandular tissue.

RECOMMENDATION:
Screening mammogram in one year.(Code:[id number])

I have discussed the findings and recommendations with the patient.
If applicable, a reminder letter will be sent to the patient
regarding the next appointment.

BI-RADS CATEGORY  1: Negative.

## 2023-03-20 ENCOUNTER — Other Ambulatory Visit: Payer: Self-pay | Admitting: Sports Medicine

## 2023-03-20 DIAGNOSIS — W19XXXA Unspecified fall, initial encounter: Secondary | ICD-10-CM

## 2023-03-20 DIAGNOSIS — M75101 Unspecified rotator cuff tear or rupture of right shoulder, not specified as traumatic: Secondary | ICD-10-CM

## 2023-03-24 ENCOUNTER — Other Ambulatory Visit: Payer: Self-pay | Admitting: Dermatology

## 2023-03-24 DIAGNOSIS — L304 Erythema intertrigo: Secondary | ICD-10-CM

## 2023-04-04 ENCOUNTER — Encounter: Payer: Self-pay | Admitting: Sports Medicine

## 2023-04-06 ENCOUNTER — Ambulatory Visit
Admission: RE | Admit: 2023-04-06 | Discharge: 2023-04-06 | Disposition: A | Discharge status: Home / Self Care | Payer: Medicare PPO | Source: Ambulatory Visit | Attending: Sports Medicine | Admitting: Sports Medicine

## 2023-04-06 DIAGNOSIS — W19XXXA Unspecified fall, initial encounter: Secondary | ICD-10-CM

## 2023-04-06 DIAGNOSIS — M75101 Unspecified rotator cuff tear or rupture of right shoulder, not specified as traumatic: Secondary | ICD-10-CM

## 2023-07-28 ENCOUNTER — Other Ambulatory Visit: Payer: Self-pay | Admitting: Obstetrics and Gynecology

## 2023-07-28 DIAGNOSIS — Z1231 Encounter for screening mammogram for malignant neoplasm of breast: Secondary | ICD-10-CM

## 2023-09-05 ENCOUNTER — Ambulatory Visit (INDEPENDENT_AMBULATORY_CARE_PROVIDER_SITE_OTHER): Payer: Medicare PPO | Admitting: Dermatology

## 2023-09-05 DIAGNOSIS — L810 Postinflammatory hyperpigmentation: Secondary | ICD-10-CM | POA: Diagnosis not present

## 2023-09-05 DIAGNOSIS — L300 Nummular dermatitis: Secondary | ICD-10-CM | POA: Diagnosis not present

## 2023-09-05 DIAGNOSIS — L219 Seborrheic dermatitis, unspecified: Secondary | ICD-10-CM

## 2023-09-05 DIAGNOSIS — L304 Erythema intertrigo: Secondary | ICD-10-CM | POA: Diagnosis not present

## 2023-09-05 MED ORDER — FLUOCINOLONE ACETONIDE 0.01 % OT OIL: TOPICAL_OIL | OTIC | 5 refills | Status: AC

## 2023-09-05 MED ORDER — HYDROCORTISONE 2.5 % EX CREA: TOPICAL_CREAM | CUTANEOUS | 3 refills | Status: DC

## 2023-09-05 MED ORDER — KETOCONAZOLE 2 % EX CREA
TOPICAL_CREAM | CUTANEOUS | 5 refills | Status: AC
Start: 2023-09-05 — End: ?

## 2023-09-05 MED ORDER — VTAMA 1 % EX CREA: TOPICAL_CREAM | CUTANEOUS | 2 refills | Status: DC

## 2023-09-05 MED ORDER — CALCIPOTRIENE-BETAMETH DIPROP 0.005-0.064 % EX SUSP: CUTANEOUS | 2 refills | Status: DC

## 2023-09-05 NOTE — Progress Notes (Signed)
 Follow Up Visit   Subjective  Krista Morris is a 73 y.o. female who presents for the following: 1 year follow-up. She has intertrigo of the inframammary and uses ketoconazole 2% cream and 2.5% hydrocortisone cream. 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 Nummular Dermatitis with PIH of the spinal mid back, using desoximetasone 0.25% cream.    The following portions of the chart were reviewed this encounter and updated as appropriate: medications, allergies, medical history  Review of Systems:  No other skin or systemic complaints except as noted in HPI or Assessment and Plan.  Objective  Well appearing patient in no apparent distress; mood and affect are within normal limits.  A focused examination was performed of the following areas: Face, ears, trunk  Relevant exam findings are noted in the Assessment and Plan.    Assessment & Plan  INTERTRIGO Exam: Clear today per patient, not examined.   Chronic condition with duration or expected duration over one year. Currently well-controlled.   Intertrigo is a chronic recurrent rash that occurs in skin fold areas that may be associated with friction; heat; moisture; yeast; fungus; and bacteria.  It is exacerbated by increased movement / activity; sweating; and higher atmospheric temperature.  Use of an absorbant powder such as Zeasorb AF powder or other OTC antifungal powder to the area daily can prevent rash recurrence. Other options to help keep the area dry include blow drying the area after bathing or using antiperspirant products such as Duradry sweat minimizing gel.  Treatment Plan: Continue - Mix hydrocortisone with ketaconazole 2% twice a day. If improved, decrease to hydrocortisone and ketaconazole mixed once a day. If still clear, decrease to ketaconazole only.  May try Vtama Cream to skin folds. Safe to use daily.   SEBORRHEIC DERMATITIS Vs Psoriasis Exam: Pink  scaly patches with hyperpigmentation at bilateral ear canals   Seborrheic Dermatitis is a chronic persistent rash characterized by pinkness and scaling most commonly of the mid face but also can occur on the scalp (dandruff), ears; mid chest, mid back and groin.  It tends to be exacerbated by stress and cooler weather.  People who have neurologic disease may experience new onset or exacerbation of existing seborrheic dermatitis.  The condition is not curable but treatable and can be controlled.  Treatment Plan: Start Vtama Cream Apply to ears once a day dsp 60g 2Rf. Continue Taclonex suspension prn flares. Continue Dermotic oil to ears once a day as needed.  Topical steroids (such as triamcinolone, fluocinolone, fluocinonide, mometasone, clobetasol, halobetasol, betamethasone, hydrocortisone) can cause thinning and lightening of the skin if they are used for too long in the same area. Your physician has selected the right strength medicine for your problem and area affected on the body. Please use your medication only as directed by your physician to prevent side effects.    Nummular Dermatitis vs psoriasis- with post-inflammatory hyperpimentation (PIH)  Exam: Light hyperpigmented patch with focal scaling at spinal mid back.  Chronic and persistent condition with duration or expected duration over one year. Condition is improving with treatment but not currently at goal.  Nummular dermatitis (eczema) is a chronic, relapsing, itchy rash that can significantly affect quality of life. It is often associated with dry skin and flares in the wintertime, and may require treatment with prescription topical anti-inflammatory medications, in addition to gentle skin care.  If there is associated atopic dermatitis and topicals are not working, then biologic injections may be  necessary to clear rash and control symptoms.  Treatment Plan: Start Taclonex once a day as needed for itch. May also use Vtama  Cream every day as needed.   Recommend mild soap and moisturizing cream 1-2 times daily.  Gentle skin care handout provided.    ERYTHEMA INTERTRIGO   Related Medications hydrocortisone 2.5 % cream Mix hydrocortisone with ketaconazole 2% twice a day. If improved, decrease to hydrocortisone and ketaconazole mixed once a day. If still clear, decrease to ketaconazole only SEBORRHEIC DERMATITIS   Related Medications calcipotriene-betamethasone (TACLONEX) external suspension Apply topically to ears daily up to 2 weeks at a time as needed.  Return in about 1 year (around 09/04/2024).  IBernardine Bridegroom, CMA, am acting as scribe for Artemio Larry, MD .   Documentation: I have reviewed the above documentation for accuracy and completeness, and I agree with the above.  Artemio Larry, MD

## 2023-09-05 NOTE — Patient Instructions (Addendum)
 Start Vtama cream - apply to and in ears every day. Safe to use daily on any area of the body (including under breasts). May also use to itchy patch on back (not as strong as Taclonex).  Continue Taclonex (calcipotriene-betamethasone) suspension to ears daily up to 2 weeks at a time. Use as needed for flares, not to be used daily. May also use to itchy patch on back as needed for itch.   Continue Dermotic (fluocinolone) Oil to ears daily, as needed.  Intertrigo (rash under breasts) Mix hydrocortisone with ketaconazole 2% twice a day. If improved, decrease to hydrocortisone and ketaconazole mixed once a day. If still clear, decrease to ketaconazole only.    Due to recent changes in healthcare laws, you may see results of your pathology and/or laboratory studies on MyChart before the doctors have had a chance to review them. We understand that in some cases there may be results that are confusing or concerning to you. Please understand that not all results are received at the same time and often the doctors may need to interpret multiple results in order to provide you with the best plan of care or course of treatment. Therefore, we ask that you please give [place] 2 business days to thoroughly review all your results before contacting the office for clarification. Should we see a critical lab result, you will be contacted sooner.   If You Need Anything After Your Visit  If you have any questions or concerns for your doctor, please call our main line at [phone] and press option 4 to reach your doctor's medical assistant. If no one answers, please leave a voicemail as directed and we will return your call as soon as possible. Messages left after 4 pm will be answered the following business day.   You may also send [place] a message via MyChart. We typically respond to MyChart messages within 1-2 business days.  For prescription refills, please ask your pharmacy to contact our office. Our fax number is  [phone].  If you have an urgent issue when the clinic is closed that cannot wait until the next business day, you can page your doctor at the number below.    Please note that while we do our best to be available for urgent issues outside of office hours, we are not available 24/7.   If you have an urgent issue and are unable to reach [place], you may choose to seek medical care at your doctor's office, retail clinic, urgent care center, or emergency room.  If you have a medical emergency, please immediately call 911 or go to the emergency department.  Pager Numbers  - Dr. Gwen Pounds: [phone]  - Dr. Roseanne Reno: [phone]  - Dr. Katrinka Blazing: [phone]   In the event of inclement weather, please call our main line at [phone] for an update on the status of any delays or closures.  Dermatology Medication Tips: Please keep the boxes that topical medications come in in order to help keep track of the instructions about where and how to use these. Pharmacies typically print the medication instructions only on the boxes and not directly on the medication tubes.   If your medication is too expensive, please contact our office at [phone] option 4 or send [place] a message through MyChart.   We are unable to tell what your co-pay for medications will be in advance as this is different depending on your insurance coverage. However, we may be able to find a substitute medication at lower  cost or fill out paperwork to get insurance to cover a needed medication.   If a prior authorization is required to get your medication covered by your insurance company, please allow [place] 1-2 business days to complete this process.  Drug prices often vary depending on where the prescription is filled and some pharmacies may offer cheaper prices.  The website www.goodrx.com contains coupons for medications through different pharmacies. The prices here do not account for what the cost may be with help from  insurance (it may be cheaper with your insurance), but the website can give you the price if you did not use any insurance.  - You can print the associated coupon and take it with your prescription to the pharmacy.  - You may also stop by our office during regular business hours and pick up a GoodRx coupon card.  - If you need your prescription sent electronically to a different pharmacy, notify our office through [facility] or by phone at [phone] option 4.     Si Usted Necesita Algo Despus de Su Visita  Tambin puede enviarnos un mensaje a travs de Clinical cytogeneticist. Por lo general respondemos a los mensajes de MyChart en el transcurso de 1 a 2 das hbiles.  Para renovar recetas, por favor pida a su farmacia que se ponga en contacto con nuestra oficina. Annie Sable de fax es [place] [phone].  Si tiene un asunto urgente cuando la clnica est cerrada y que no puede esperar hasta el siguiente da hbil, puede llamar/localizar a su doctor(a) al nmero que aparece a continuacin.   Por favor, tenga en cuenta que aunque hacemos todo lo posible para estar disponibles para asuntos urgentes fuera del horario de [place], no estamos disponibles las 24 horas del da, los 7 [address]  [address] de la [place].   Si tiene un problema urgente y no puede comunicarse con nosotros, puede optar por buscar atencin mdica  en el consultorio de su doctor(a), en una clnica privada, en un centro de atencin urgente o en una sala de emergencias.  Si tiene Engineer, drilling, por favor llame inmediatamente al 911 o vaya a la sala de emergencias.  Nmeros de bper  - Dr. Gwen Pounds: [phone]  - Dra. Roseanne Reno: [phone]  - Dr. Katrinka Blazing: [phone]   En caso de inclemencias del tiempo, por favor llame a Lacy Duverney principal al [phone] para una actualizacin sobre el [place] de cualquier retraso o cierre.  Consejos para la medicacin en dermatologa: Por favor, guarde las cajas en las que vienen los  medicamentos de uso tpico para ayudarle a seguir las instrucciones sobre dnde y cmo usarlos. Las farmacias generalmente imprimen las instrucciones del medicamento slo en las cajas y no directamente en los tubos del [place].   Si su medicamento es muy caro, por favor, pngase en contacto con Rolm Gala llamando al [phone] y presione la opcin 4 o envenos un mensaje a travs de Clinical cytogeneticist.   No podemos decirle cul ser su copago por los medicamentos por adelantado ya que esto es diferente dependiendo de la cobertura de su seguro. Sin embargo, es posible que podamos encontrar un medicamento sustituto a Audiological scientist un formulario para que el seguro cubra el medicamento que se considera necesario.   Si se requiere una autorizacin previa para que su compaa de seguros [place] su medicamento, por favor permtanos de 1 a 2 das hbiles para completar [address]  Los precios de los medicamentos varan con frecuencia dependiendo del Environmental consultant de dnde se  surte la receta y alguna farmacias pueden ofrecer precios ms baratos.  El sitio web www.goodrx.com tiene cupones para medicamentos de Health and safety inspector. Los precios aqu no tienen en cuenta lo que podra costar con la ayuda del seguro (puede ser ms barato con su seguro), pero el sitio web puede darle el precio si no utiliz Tourist information centre manager.  - Puede imprimir el cupn correspondiente y llevarlo con su receta a la farmacia.  - Tambin puede pasar por nuestra oficina durante el horario de atencin regular y Education officer, museum una tarjeta de cupones de GoodRx.  - Si necesita que su receta se enve electrnicamente a una farmacia diferente, informe a nuestra oficina a travs de MyChart de [place] o por telfono llamando al [phone] y presione la opcin 4.

## 2023-10-10 ENCOUNTER — Other Ambulatory Visit: Payer: Self-pay | Admitting: Surgery

## 2023-10-11 ENCOUNTER — Encounter
Admission: RE | Disposition: A | Discharge status: Home / Self Care | Payer: Self-pay | Source: Home / Self Care | Attending: Internal Medicine

## 2023-10-11 ENCOUNTER — Encounter: Payer: Self-pay | Admitting: Anesthesiology

## 2023-10-11 ENCOUNTER — Ambulatory Visit
Admission: RE | Admit: 2023-10-11 | Discharge: 2023-10-11 | Disposition: A | Discharge status: Home / Self Care | Attending: Internal Medicine | Admitting: Internal Medicine

## 2023-10-11 DIAGNOSIS — Z539 Procedure and treatment not carried out, unspecified reason: Secondary | ICD-10-CM | POA: Insufficient documentation

## 2023-10-11 DIAGNOSIS — Z1211 Encounter for screening for malignant neoplasm of colon: Secondary | ICD-10-CM | POA: Insufficient documentation

## 2023-10-11 SURGERY — COLONOSCOPY
Anesthesia: General

## 2023-10-11 MED ORDER — SODIUM CHLORIDE 0.9 % IV SOLN: INTRAVENOUS | Status: DC

## 2023-10-17 ENCOUNTER — Ambulatory Visit
Admission: RE | Admit: 2023-10-17 | Discharge: 2023-10-17 | Disposition: A | Discharge status: Home / Self Care | Source: Ambulatory Visit | Attending: Obstetrics and Gynecology | Admitting: Obstetrics and Gynecology

## 2023-10-17 DIAGNOSIS — Z1231 Encounter for screening mammogram for malignant neoplasm of breast: Secondary | ICD-10-CM | POA: Insufficient documentation

## 2023-10-18 ENCOUNTER — Other Ambulatory Visit: Payer: Self-pay

## 2023-10-18 ENCOUNTER — Encounter
Admission: RE | Admit: 2023-10-18 | Discharge: 2023-10-18 | Disposition: A | Discharge status: Home / Self Care | Source: Ambulatory Visit | Attending: Surgery | Admitting: Surgery

## 2023-10-18 DIAGNOSIS — E119 Type 2 diabetes mellitus without complications: Secondary | ICD-10-CM

## 2023-10-18 DIAGNOSIS — Z01812 Encounter for preprocedural laboratory examination: Secondary | ICD-10-CM

## 2023-10-18 DIAGNOSIS — Z01810 Encounter for preprocedural cardiovascular examination: Secondary | ICD-10-CM

## 2023-10-18 DIAGNOSIS — I10 Essential (primary) hypertension: Secondary | ICD-10-CM

## 2023-10-18 DIAGNOSIS — Z01818 Encounter for other preprocedural examination: Secondary | ICD-10-CM

## 2023-10-18 DIAGNOSIS — Z79899 Other long term (current) drug therapy: Secondary | ICD-10-CM

## 2023-10-18 HISTORY — DX: Unspecified osteoarthritis, unspecified site: M19.90

## 2023-10-18 HISTORY — DX: Other specified congenital malformations of skin: Q82.8

## 2023-10-18 HISTORY — DX: Unspecified hemorrhoids: K64.9

## 2023-10-18 HISTORY — DX: Cardiac murmur, unspecified: R01.1

## 2023-10-18 HISTORY — DX: Complete rotator cuff tear or rupture of right shoulder, not specified as traumatic: M75.121

## 2023-10-18 HISTORY — DX: Anemia, unspecified: D64.9

## 2023-10-18 HISTORY — DX: Other shoulder lesions, unspecified shoulder: M75.80

## 2023-10-18 HISTORY — DX: Type 2 diabetes mellitus without complications: E11.9

## 2023-10-18 NOTE — Patient Instructions (Addendum)
 Your procedure is scheduled on:10-25-23 Wednesday Report to the Registration Desk on the 1st floor of the [facility].Then proceed to the 2nd floor Surgery Desk To find out your arrival time, please call [phone] between 1PM - 3PM on:10-24-23 Tuesday If your arrival time is 6:00 am, do not arrive before that time as the [facility] entrance doors do not open until 6:00 am.  REMEMBER: Instructions that are not followed completely may result in serious medical risk, up to and including death; or upon the discretion of your surgeon and anesthesiologist your surgery may need to be rescheduled.  Do not eat food after midnight the night before surgery.  No gum chewing or hard candies.  You may however, drink Water up to 2 hours before you are scheduled to arrive for your surgery. Do not drink anything within 2 hours of your scheduled arrival time.  In addition, your doctor has ordered for you to drink the provided:  Gatorade G2 Drinking this carbohydrate drink up to two hours before surgery helps to reduce insulin resistance and improve patient outcomes. Please complete drinking 2 hours before scheduled arrival time.  One week prior to surgery:Stop NOW (10-18-23) Stop Anti-inflammatories (NSAIDS) such as Advil, Aleve, Ibuprofen, Motrin, Naproxen, Naprosyn and Aspirin based products such as Excedrin, Goody's Powder, BC Powder. Stop ANY OVER THE COUNTER supplements until after surgery (Vitamin D3)  You may however, continue to take Tylenol if needed for pain up until the day of surgery.  Continue taking all of your other prescription medications up until the day of surgery.  ON THE DAY OF SURGERY ONLY TAKE THESE MEDICATIONS WITH SIPS OF WATER: -amLODipine (NORVASC)  -metoprolol tartrate (LOPRESSOR)   Stop your 81 mg Aspirin NOW (10-18-23)   No Alcohol for 24 hours before or after surgery.  No Smoking including e-cigarettes for 24 hours before surgery.  No chewable tobacco products for at  least 6 hours before surgery.  No nicotine patches on the day of surgery.  Do not use any "recreational" drugs for at least a week (preferably 2 weeks) before your surgery.  Please be advised that the combination of cocaine and anesthesia may have negative outcomes, up to and including death. If you test positive for cocaine, your surgery will be cancelled.  On the morning of surgery brush your teeth with toothpaste and water, you may rinse your mouth with mouthwash if you wish. Do not swallow any toothpaste or mouthwash.  Use CHG Soap as directed on instruction sheet.  Do not wear jewelry, make-up, hairpins, clips or nail polish.  For welded (permanent) jewelry: bracelets, anklets, waist bands, etc.  Please have this removed prior to surgery.  If it is not removed, there is a chance that hospital personnel will need to cut it off on the day of surgery.  Do not wear lotions, powders, or perfumes.   Do not shave body hair from the neck down 48 hours before surgery.  Contact lenses, hearing aids and dentures may not be worn into surgery.  Do not bring valuables to the hospital. [facility] is not responsible for any missing/lost belongings or valuables.   Notify your doctor if there is any change in your medical condition (cold, fever, infection).  Wear comfortable clothing (specific to your surgery type) to the hospital.  After surgery, you can help prevent lung complications by doing breathing exercises.  Take deep breaths and cough every 1-2 hours. Your doctor may order a device called an Incentive Spirometer to help  you take deep breaths. When coughing or sneezing, hold a pillow firmly against your incision with both hands. This is called "splinting." Doing this helps protect your incision. It also decreases belly discomfort.  If you are being admitted to the hospital overnight, leave your suitcase in the car. After surgery it may be brought to your room.  In case of increased  patient census, it may be necessary for you, the patient, to continue your postoperative care in the Same Day Surgery department.  If you are being discharged the day of surgery, you will not be allowed to drive home. You will need a responsible individual to drive you home and stay with you for 24 hours after surgery.   If you are taking public transportation, you will need to have a responsible individual with you.  Please call the Pre-admissions Testing Dept. at [phone] if you have any questions about these instructions.  Surgery Visitation Policy:  Patients having surgery or a procedure may have two visitors.  Children under the age of 74 must have an adult with them who is not the patient.     Preparing for Surgery with CHLORHEXIDINE GLUCONATE (CHG) Soap  Chlorhexidine Gluconate (CHG) Soap  o An antiseptic cleaner that kills germs and bonds with the skin to continue killing germs even after washing  o Used for showering the night before surgery and morning of surgery  Before surgery, you can play an important role by reducing the number of germs on your skin.  CHG (Chlorhexidine gluconate) soap is an antiseptic cleanser which kills germs and bonds with the skin to continue killing germs even after washing.  Please do not use if you have an allergy to CHG or antibacterial soaps. If your skin becomes reddened/irritated stop using the CHG.  1. Shower the NIGHT BEFORE SURGERY and the MORNING OF SURGERY with CHG soap.  2. If you choose to wash your hair, wash your hair first as usual with your normal shampoo.  3. After shampooing, rinse your hair and body thoroughly to remove the shampoo.  4. Use CHG as you would any other liquid soap. You can apply CHG directly to the skin and wash gently with a scrungie or a clean washcloth.  5. Apply the CHG soap to your body only from the neck down. Do not use on open wounds or open sores. Avoid contact with your eyes, ears, mouth,  and genitals (private parts). Wash face and genitals (private parts) with your normal soap.  6. Wash thoroughly, paying special attention to the area where your surgery will be performed.  7. Thoroughly rinse your body with warm water.  8. Do not shower/wash with your normal soap after using and rinsing off the CHG soap.  9. Pat yourself dry with a clean towel.  10. Wear clean pajamas to bed the night before surgery.  12. Place clean sheets on your bed the night of your first shower and do not sleep with pets.  13. Shower again with the CHG soap on the day of surgery prior to arriving at the hospital.  14. Do not apply any deodorants/lotions/powders.  15. Please wear clean clothes to the hospital.  How to Use an Incentive Spirometer An incentive spirometer is a tool that measures how well you are filling your lungs with each breath. Learning to take long, deep breaths using this tool can help you keep your lungs clear and active. This may help to reverse or lessen your chance of developing  breathing (pulmonary) problems, especially infection. You may be asked to use a spirometer: After a surgery. If you have a lung problem or a history of smoking. After a long period of time when you have been unable to move or be active. If the spirometer includes an indicator to show the highest number that you have reached, your health care provider or respiratory therapist will help you set a goal. Keep a log of your progress as told by your health care provider. What are the risks? Breathing too quickly may cause dizziness or cause you to pass out. Take your time so you do not get dizzy or light-headed. If you are in pain, you may need to take pain medicine before doing incentive spirometry. It is harder to take a deep breath if you are having pain. How to use your incentive spirometer  Sit up on the edge of your bed or on a chair. Hold the incentive spirometer so that it is in an upright  position. Before you use the spirometer, breathe out normally. Place the mouthpiece in your mouth. Make sure your lips are closed tightly around it. Breathe in slowly and as deeply as you can through your mouth, causing the piston or the ball to rise toward the top of the chamber. Hold your breath for 3-5 seconds, or for as long as possible. If the spirometer includes a coach indicator, use this to guide you in breathing. Slow down your breathing if the indicator goes above the marked areas. Remove the mouthpiece from your mouth and breathe out normally. The piston or ball will return to the bottom of the chamber. Rest for a few seconds, then repeat the steps 10 or more times. Take your time and take a few normal breaths between deep breaths so that you do not get dizzy or light-headed. Do this every 1-2 hours when you are awake. If the spirometer includes a goal marker to show the highest number you have reached (best effort), use this as a goal to work toward during each repetition. After each set of 10 deep breaths, cough a few times. This will help to make sure that your lungs are clear. If you have an incision on your chest or abdomen from surgery, place a pillow or a rolled-up towel firmly against the incision when you cough. This can help to reduce pain while taking deep breaths and coughing. General tips When you are able to get out of bed: Walk around often. Continue to take deep breaths and cough in order to clear your lungs. Keep using the incentive spirometer until your health care provider says it is okay to stop using it. If you have been in the hospital, you may be told to keep using the spirometer at home. Contact a health care provider if: You are having difficulty using the spirometer. You have trouble using the spirometer as often as instructed. Your pain medicine is not giving enough relief for you to use the spirometer as told. You have a fever. Get help right away  if: You develop shortness of breath. You develop a cough with bloody mucus from the lungs. You have fluid or blood coming from an incision site after you cough. Summary An incentive spirometer is a tool that can help you learn to take long, deep breaths to keep your lungs clear and active. You may be asked to use a spirometer after a surgery, if you have a lung problem or a history of smoking, or if  you have been inactive for a long period of time. Use your incentive spirometer as instructed every 1-2 hours while you are awake. If you have an incision on your chest or abdomen, place a pillow or a rolled-up towel firmly against your incision when you cough. This will help to reduce pain. Get help right away if you have shortness of breath, you cough up bloody mucus, or blood comes from your incision when you cough. This information is not intended to replace advice given to you by your health care provider. Make sure you discuss any questions you have with your health care provider. Document Revised: 03/17/2023 Document Reviewed: 03/17/2023 Elsevier Patient Education  2024 [employer].

## 2023-10-19 ENCOUNTER — Encounter
Admission: RE | Admit: 2023-10-19 | Discharge: 2023-10-19 | Disposition: A | Discharge status: Home / Self Care | Source: Ambulatory Visit | Attending: Surgery | Admitting: Surgery

## 2023-10-19 DIAGNOSIS — Z79899 Other long term (current) drug therapy: Secondary | ICD-10-CM | POA: Insufficient documentation

## 2023-10-19 DIAGNOSIS — Z01812 Encounter for preprocedural laboratory examination: Secondary | ICD-10-CM

## 2023-10-19 DIAGNOSIS — Z01818 Encounter for other preprocedural examination: Secondary | ICD-10-CM | POA: Insufficient documentation

## 2023-10-19 DIAGNOSIS — E119 Type 2 diabetes mellitus without complications: Secondary | ICD-10-CM | POA: Insufficient documentation

## 2023-10-19 DIAGNOSIS — Z01810 Encounter for preprocedural cardiovascular examination: Secondary | ICD-10-CM

## 2023-10-19 DIAGNOSIS — I498 Other specified cardiac arrhythmias: Secondary | ICD-10-CM | POA: Diagnosis not present

## 2023-10-19 DIAGNOSIS — I10 Essential (primary) hypertension: Secondary | ICD-10-CM | POA: Insufficient documentation

## 2023-10-19 LAB — BASIC METABOLIC PANEL WITH GFR
Anion gap: 8 (ref 5–15)
BUN: 13 mg/dL (ref 8–23)
CO2: 24 mmol/L (ref 22–32)
Calcium: 9.2 mg/dL (ref 8.9–10.3)
Chloride: 108 mmol/L (ref 98–111)
Creatinine, Ser: 0.73 mg/dL (ref 0.44–1.00)
GFR, Estimated: >60 mL/min (ref >60)
Glucose, Bld: 97 mg/dL (ref 70–99)
Potassium: 3.8 mmol/L (ref 3.5–5.1)
Sodium: 140 mmol/L (ref 135–145)

## 2023-10-24 MED ORDER — CEFAZOLIN SODIUM-DEXTROSE 2-4 GM/100ML-% IV SOLN
2.0000 g | INTRAVENOUS | Status: AC
  Administered 2023-10-25: 2 g via INTRAVENOUS

## 2023-10-24 MED ORDER — CHLORHEXIDINE GLUCONATE 0.12 % MT SOLN
15.0000 mL | Freq: Once | OROMUCOSAL | Status: AC
Start: 2023-10-24 — End: 2023-10-25
  Administered 2023-10-25: 15 mL via OROMUCOSAL

## 2023-10-24 MED ORDER — ORAL CARE MOUTH RINSE
15.0000 mL | Freq: Once | OROMUCOSAL | Status: AC
Start: 2023-10-24 — End: 2023-10-25

## 2023-10-25 ENCOUNTER — Other Ambulatory Visit: Payer: Self-pay

## 2023-10-25 ENCOUNTER — Ambulatory Visit

## 2023-10-25 ENCOUNTER — Ambulatory Visit: Admitting: General Practice

## 2023-10-25 ENCOUNTER — Encounter
Admission: RE | Disposition: A | Discharge status: Home / Self Care | Payer: Self-pay | Source: Ambulatory Visit | Attending: Surgery

## 2023-10-25 ENCOUNTER — Encounter: Payer: Self-pay | Admitting: Surgery

## 2023-10-25 ENCOUNTER — Ambulatory Visit
Admission: RE | Admit: 2023-10-25 | Discharge: 2023-10-25 | Disposition: A | Discharge status: Home / Self Care | Source: Ambulatory Visit | Attending: Surgery | Admitting: Surgery

## 2023-10-25 ENCOUNTER — Ambulatory Visit: Payer: Self-pay | Admitting: Urgent Care

## 2023-10-25 DIAGNOSIS — W010XXA Fall on same level from slipping, tripping and stumbling without subsequent striking against object, initial encounter: Secondary | ICD-10-CM | POA: Diagnosis not present

## 2023-10-25 DIAGNOSIS — Z01818 Encounter for other preprocedural examination: Secondary | ICD-10-CM

## 2023-10-25 DIAGNOSIS — I10 Essential (primary) hypertension: Secondary | ICD-10-CM | POA: Diagnosis not present

## 2023-10-25 DIAGNOSIS — Z833 Family history of diabetes mellitus: Secondary | ICD-10-CM | POA: Diagnosis not present

## 2023-10-25 DIAGNOSIS — M25811 Other specified joint disorders, right shoulder: Secondary | ICD-10-CM | POA: Diagnosis not present

## 2023-10-25 DIAGNOSIS — M7521 Bicipital tendinitis, right shoulder: Secondary | ICD-10-CM | POA: Diagnosis present

## 2023-10-25 DIAGNOSIS — M75121 Complete rotator cuff tear or rupture of right shoulder, not specified as traumatic: Secondary | ICD-10-CM | POA: Insufficient documentation

## 2023-10-25 DIAGNOSIS — Z79899 Other long term (current) drug therapy: Secondary | ICD-10-CM | POA: Insufficient documentation

## 2023-10-25 DIAGNOSIS — E119 Type 2 diabetes mellitus without complications: Secondary | ICD-10-CM | POA: Insufficient documentation

## 2023-10-25 DIAGNOSIS — Z7951 Long term (current) use of inhaled steroids: Secondary | ICD-10-CM | POA: Insufficient documentation

## 2023-10-25 DIAGNOSIS — Z7982 Long term (current) use of aspirin: Secondary | ICD-10-CM | POA: Insufficient documentation

## 2023-10-25 DIAGNOSIS — M19011 Primary osteoarthritis, right shoulder: Secondary | ICD-10-CM | POA: Insufficient documentation

## 2023-10-25 DIAGNOSIS — M7541 Impingement syndrome of right shoulder: Secondary | ICD-10-CM | POA: Insufficient documentation

## 2023-10-25 DIAGNOSIS — M7581 Other shoulder lesions, right shoulder: Secondary | ICD-10-CM | POA: Diagnosis present

## 2023-10-25 HISTORY — PX: POSTERIOR LUMBAR FUSION 2 WITH HARDWARE REMOVAL: SHX7297

## 2023-10-25 LAB — GLUCOSE, CAPILLARY: Glucose-Capillary: 103 mg/dL — ABNORMAL HIGH (ref 70–99)

## 2023-10-25 SURGERY — ARTHROSCOPY, SHOULDER WITH DEBRIDEMENT
Anesthesia: General | Site: Shoulder | Laterality: Right

## 2023-10-25 MED ORDER — FENTANYL CITRATE PF 50 MCG/ML IJ SOSY
PREFILLED_SYRINGE | INTRAMUSCULAR | Status: AC
  Filled 2023-10-25: qty 1

## 2023-10-25 MED ORDER — BUPIVACAINE LIPOSOME 1.3 % IJ SUSP
INTRAMUSCULAR | Status: DC | PRN
  Administered 2023-10-25: 20 mL

## 2023-10-25 MED ORDER — LACTATED RINGERS IV SOLN
INTRAVENOUS | Status: DC | PRN
  Administered 2023-10-25: 3001 mL

## 2023-10-25 MED ORDER — BUPIVACAINE LIPOSOME 1.3 % IJ SUSP
INTRAMUSCULAR | Status: AC
Start: 2023-10-25 — End: ?
  Filled 2023-10-25: qty 20

## 2023-10-25 MED ORDER — OXYCODONE HCL 5 MG PO TABS: 5.0000 mg | ORAL_TABLET | Freq: Once | ORAL | Status: DC | PRN

## 2023-10-25 MED ORDER — DEXAMETHASONE SODIUM PHOSPHATE 10 MG/ML IJ SOLN
INTRAMUSCULAR | Status: AC
  Filled 2023-10-25: qty 1

## 2023-10-25 MED ORDER — PHENYLEPHRINE HCL-NACL 20-0.9 MG/250ML-% IV SOLN
INTRAVENOUS | Status: DC | PRN
  Administered 2023-10-25: 30 ug/min via INTRAVENOUS

## 2023-10-25 MED ORDER — CHLORHEXIDINE GLUCONATE 0.12 % MT SOLN
OROMUCOSAL | Status: AC
  Filled 2023-10-25: qty 15

## 2023-10-25 MED ORDER — MIDAZOLAM HCL 2 MG/2ML IJ SOLN
INTRAMUSCULAR | Status: AC
  Filled 2023-10-25: qty 2

## 2023-10-25 MED ORDER — FENTANYL CITRATE PF 50 MCG/ML IJ SOSY
50.0000 ug | PREFILLED_SYRINGE | Freq: Once | INTRAMUSCULAR | Status: AC
  Administered 2023-10-25: 50 ug via INTRAVENOUS

## 2023-10-25 MED ORDER — PHENYLEPHRINE 80 MCG/ML (10ML) SYRINGE FOR IV PUSH (FOR BLOOD PRESSURE SUPPORT)
PREFILLED_SYRINGE | INTRAVENOUS | Status: DC | PRN
  Administered 2023-10-25 (×4): 80 ug via INTRAVENOUS
  Administered 2023-10-25: 160 ug via INTRAVENOUS

## 2023-10-25 MED ORDER — MIDAZOLAM HCL 2 MG/2ML IJ SOLN
1.0000 mg | INTRAMUSCULAR | Status: DC | PRN
  Administered 2023-10-25: 1 mg via INTRAVENOUS

## 2023-10-25 MED ORDER — FENTANYL CITRATE (PF) 250 MCG/5ML IJ SOLN
INTRAMUSCULAR | Status: AC
  Filled 2023-10-25: qty 5

## 2023-10-25 MED ORDER — PROPOFOL 1000 MG/100ML IV EMUL
INTRAVENOUS | Status: AC
  Filled 2023-10-25: qty 100

## 2023-10-25 MED ORDER — ONDANSETRON HCL 4 MG/2ML IJ SOLN
INTRAMUSCULAR | Status: AC
  Filled 2023-10-25: qty 2

## 2023-10-25 MED ORDER — LIDOCAINE HCL (CARDIAC) PF 100 MG/5ML IV SOSY
PREFILLED_SYRINGE | INTRAVENOUS | Status: DC | PRN
  Administered 2023-10-25: 100 mg via INTRAVENOUS

## 2023-10-25 MED ORDER — OXYCODONE HCL 5 MG PO TABS: 5.0000 mg | ORAL_TABLET | ORAL | 0 refills | Status: DC | PRN

## 2023-10-25 MED ORDER — EPINEPHRINE PF 1 MG/ML IJ SOLN
INTRAMUSCULAR | Status: AC
  Filled 2023-10-25: qty 1

## 2023-10-25 MED ORDER — DEXAMETHASONE SODIUM PHOSPHATE 10 MG/ML IJ SOLN
INTRAMUSCULAR | Status: AC
Start: 2023-10-25 — End: ?
  Filled 2023-10-25: qty 1

## 2023-10-25 MED ORDER — FENTANYL CITRATE (PF) 100 MCG/2ML IJ SOLN: 25.0000 ug | INTRAMUSCULAR | Status: DC | PRN

## 2023-10-25 MED ORDER — PROPOFOL 10 MG/ML IV BOLUS
INTRAVENOUS | Status: DC | PRN
  Administered 2023-10-25: 125 ug/kg/min via INTRAVENOUS
  Administered 2023-10-25: 150 mg via INTRAVENOUS

## 2023-10-25 MED ORDER — LIDOCAINE HCL (PF) 2 % IJ SOLN
INTRAMUSCULAR | Status: AC
Start: 2023-10-25 — End: ?
  Filled 2023-10-25: qty 5

## 2023-10-25 MED ORDER — PHENYLEPHRINE HCL-NACL 20-0.9 MG/250ML-% IV SOLN
INTRAVENOUS | Status: AC
  Filled 2023-10-25: qty 250

## 2023-10-25 MED ORDER — FENTANYL CITRATE (PF) 100 MCG/2ML IJ SOLN
INTRAMUSCULAR | Status: DC | PRN
  Administered 2023-10-25 (×2): 50 ug via INTRAVENOUS

## 2023-10-25 MED ORDER — KETOROLAC TROMETHAMINE 15 MG/ML IJ SOLN
15.0000 mg | Freq: Once | INTRAMUSCULAR | Status: AC
  Administered 2023-10-25: 15 mg via INTRAVENOUS

## 2023-10-25 MED ORDER — ONDANSETRON HCL 4 MG/2ML IJ SOLN
INTRAMUSCULAR | Status: DC | PRN
  Administered 2023-10-25: 4 mg via INTRAVENOUS

## 2023-10-25 MED ORDER — BUPIVACAINE HCL (PF) 0.5 % IJ SOLN
INTRAMUSCULAR | Status: AC
  Filled 2023-10-25: qty 10

## 2023-10-25 MED ORDER — DEXAMETHASONE SODIUM PHOSPHATE 10 MG/ML IJ SOLN
INTRAMUSCULAR | Status: DC | PRN
  Administered 2023-10-25: 10 mg via INTRAVENOUS

## 2023-10-25 MED ORDER — BUPIVACAINE-EPINEPHRINE (PF) 0.5% -1:200000 IJ SOLN
INTRAMUSCULAR | Status: AC
  Filled 2023-10-25: qty 30

## 2023-10-25 MED ORDER — BUPIVACAINE HCL (PF) 0.5 % IJ SOLN
INTRAMUSCULAR | Status: DC | PRN
  Administered 2023-10-25: 10 mL

## 2023-10-25 MED ORDER — BUPIVACAINE-EPINEPHRINE 0.5% -1:200000 IJ SOLN
INTRAMUSCULAR | Status: DC | PRN
Start: 2023-10-25 — End: 2023-10-25
  Administered 2023-10-25: 30 mL

## 2023-10-25 MED ORDER — OXYCODONE HCL 5 MG/5ML PO SOLN: 5.0000 mg | Freq: Once | ORAL | Status: DC | PRN

## 2023-10-25 MED ORDER — KETOROLAC TROMETHAMINE 15 MG/ML IJ SOLN
INTRAMUSCULAR | Status: AC
Start: 2023-10-25 — End: ?
  Filled 2023-10-25: qty 1

## 2023-10-25 MED ORDER — GLYCOPYRROLATE 0.2 MG/ML IJ SOLN
INTRAMUSCULAR | Status: DC | PRN
Start: 2023-10-25 — End: 2023-10-25
  Administered 2023-10-25: .2 mg via INTRAVENOUS

## 2023-10-25 MED ORDER — CEFAZOLIN SODIUM-DEXTROSE 2-4 GM/100ML-% IV SOLN
INTRAVENOUS | Status: AC
  Filled 2023-10-25: qty 100

## 2023-10-25 MED ORDER — ROCURONIUM BROMIDE 100 MG/10ML IV SOLN
INTRAVENOUS | Status: DC | PRN
  Administered 2023-10-25: 50 mg via INTRAVENOUS
  Administered 2023-10-25: 30 mg via INTRAVENOUS

## 2023-10-25 MED ORDER — SODIUM CHLORIDE 0.9 % IV SOLN: INTRAVENOUS | Status: DC

## 2023-10-25 MED ORDER — SUGAMMADEX SODIUM 200 MG/2ML IV SOLN
INTRAVENOUS | Status: DC | PRN
  Administered 2023-10-25: 200 mg via INTRAVENOUS

## 2023-10-25 MED ORDER — ROCURONIUM BROMIDE 10 MG/ML (PF) SYRINGE
PREFILLED_SYRINGE | INTRAVENOUS | Status: AC
  Filled 2023-10-25: qty 10

## 2023-10-25 SURGICAL SUPPLY — 48 items
ANCHOR HEALICOIL REGEN 5.5 (Anchor) IMPLANT
ANCHOR JUGGERKNOT SOFT 1.5 (Anchor) ×1 IMPLANT
ANCHOR JUGGERKNOT SOFT 2.9 (Anchor) IMPLANT
ANCHOR QFIX 2.8 SUT MINI TAPE (Anchor) IMPLANT
BIT DRILL JUGRKNT W/NDL BIT2.9 (DRILL) IMPLANT
BLADE FULL RADIUS 3.5 (BLADE) ×1 IMPLANT
BLADE SURG 10 STRL SS (BLADE) IMPLANT
BUR ACROMIONIZER 4.0 (BURR) ×1 IMPLANT
CHLORAPREP W/TINT 26 (MISCELLANEOUS) ×1 IMPLANT
COOLER POLAR GLACIER W/PUMP (MISCELLANEOUS) IMPLANT
COVER MAYO STAND STRL (DRAPES) ×1 IMPLANT
DILATOR 5.5 THREADED HEALICOIL (MISCELLANEOUS) IMPLANT
DRAPE IMP U-DRAPE 54X76 (DRAPES) IMPLANT
ELECT CAUTERY BLADE 6.4 (BLADE) ×1 IMPLANT
ELECTRODE REM PT RTRN 9FT ADLT (ELECTROSURGICAL) ×1 IMPLANT
GAUZE SPONGE 4X4 12PLY STRL (GAUZE/BANDAGES/DRESSINGS) ×1 IMPLANT
GAUZE XEROFORM 1X8 LF (GAUZE/BANDAGES/DRESSINGS) ×1 IMPLANT
GLOVE BIO SURGEON STRL SZ7.5 (GLOVE) ×2 IMPLANT
GLOVE BIO SURGEON STRL SZ8 (GLOVE) ×2 IMPLANT
GLOVE BIOGEL PI IND STRL 8 (GLOVE) ×1 IMPLANT
GLOVE INDICATOR 8.0 STRL GRN (GLOVE) ×1 IMPLANT
GOWN STRL REUS W/ TWL LRG LVL3 (GOWN DISPOSABLE) ×1 IMPLANT
GOWN STRL REUS W/ TWL XL LVL3 (GOWN DISPOSABLE) ×1 IMPLANT
GRASPER SUT 15 45D LOW PRO (SUTURE) IMPLANT
IV LR IRRIG 3000ML ARTHROMATIC (IV SOLUTION) ×1 IMPLANT
KIT CANNULA 8X76-LX IN CANNULA (CANNULA) ×1 IMPLANT
KIT SUTURE 2.8 Q-FIX DISP (MISCELLANEOUS) IMPLANT
MANIFOLD NEPTUNE II (INSTRUMENTS) ×1 IMPLANT
MASK FACE SPIDER DISP (MASK) ×1 IMPLANT
MAT ABSORB FLUID 56X50 GRAY (MISCELLANEOUS) ×1 IMPLANT
PACK ARTHROSCOPY SHOULDER (MISCELLANEOUS) ×1 IMPLANT
PAD ABD DERMACEA PRESS 5X9 (GAUZE/BANDAGES/DRESSINGS) ×2 IMPLANT
PAD WRAPON POLAR SHDR UNIV (MISCELLANEOUS) IMPLANT
PASSER SUT FIRSTPASS SELF (INSTRUMENTS) IMPLANT
PENCIL SMOKE EVACUATOR (MISCELLANEOUS) ×1 IMPLANT
SLING ULTRA II LG (MISCELLANEOUS) ×1 IMPLANT
SPONGE T-LAP 18X18 ~~LOC~~+RFID (SPONGE) ×1 IMPLANT
STAPLER SKIN PROX 35W (STAPLE) ×1 IMPLANT
STRAP SAFETY 5IN WIDE (MISCELLANEOUS) ×1 IMPLANT
SUT ETHIBOND 0 MO6 C/R (SUTURE) ×1 IMPLANT
SUT ULTRABRAID 2 COBRAID 38 (SUTURE) IMPLANT
SUT VIC AB 2-0 CT1 (SUTURE) IMPLANT
SUT VIC AB 2-0 CT1 TAPERPNT 27 (SUTURE) ×2 IMPLANT
TAPE MICROFOAM 4IN (TAPE) ×1 IMPLANT
TRAP FLUID SMOKE EVACUATOR (MISCELLANEOUS) ×1 IMPLANT
TUBE SET DOUBLEFLO INFLOW (TUBING) ×1 IMPLANT
TUBING CONNECTING 10 (TUBING) ×1 IMPLANT
WAND WEREWOLF FLOW 90D (MISCELLANEOUS) ×1 IMPLANT

## 2023-10-25 NOTE — Anesthesia Procedure Notes (Signed)
 Anesthesia Regional Block: Interscalene brachial plexus block   Pre-Anesthetic Checklist: , timeout performed,  Correct Patient, Correct Site, Correct Laterality,  Correct Procedure, Correct Position, site marked,  Risks and benefits discussed,  Surgical consent,  Pre-op evaluation,  At surgeon's request and post-op pain management  Laterality: Right  Prep: chloraprep       Needles:  Injection technique: Single-shot  Needle Type: Echogenic Needle     Needle Length: 4cm  Needle Gauge: 25     Additional Needles:   Procedures:,,,, ultrasound used (permanent image in chart),,    Narrative:  Start time: 10/25/2023 1:57 PM End time: 10/25/2023 1:59 PM Injection made incrementally with aspirations every 5 mL.  Performed by: Personally  Anesthesiologist: Enrique Harvest, MD  Additional Notes: Patient's chart reviewed and they were deemed appropriate candidate for procedure, at surgeon's request. Patient educated about risks, benefits, and alternatives of the block including but not limited to: temporary or permanent nerve damage, bleeding, infection, damage to surround tissues, pneumothorax, hemidiaphragmatic paralysis, unilateral Horner's syndrome, block failure, local anesthetic toxicity. Patient expressed understanding. A formal time-out was conducted consistent with institution rules.  Monitors were applied, and minimal sedation used (see nursing record). The site was prepped with skin prep and allowed to dry, and sterile gloves were used. A high frequency linear ultrasound probe with probe cover was utilized throughout. C5-7 nerve roots located and appeared anatomically normal, local anesthetic injected around them, and echogenic block needle trajectory was monitored throughout. Aspiration performed every 5ml. Lung and blood vessels were avoided. All injections were performed without resistance and free of blood and paresthesias. The patient tolerated the procedure well.  Injectate: 20ml  exparel + 10ml 0.5% bupivacaine

## 2023-10-25 NOTE — Anesthesia Procedure Notes (Signed)
 Procedure Name: Intubation Date/Time: 10/25/2023 2:19 PM  Performed by: Simona Dublin, CRNAPre-anesthesia Checklist: Patient identified, Emergency Drugs available, Suction available and Patient being monitored Patient Re-evaluated:Patient Re-evaluated prior to induction Oxygen Delivery Method: Circle system utilized Preoxygenation: Pre-oxygenation with 100% oxygen Induction Type: IV induction Ventilation: Mask ventilation without difficulty Laryngoscope Size: McGrath and 3 Grade View: Grade I Tube type: Oral Tube size: 6.5 mm Number of attempts: 1 Airway Equipment and Method: Stylet and Oral airway Placement Confirmation: ETT inserted through vocal cords under direct vision, positive ETCO2 and breath sounds checked- equal and bilateral Secured at: 21 cm Tube secured with: Tape Dental Injury: Teeth and Oropharynx as per pre-operative assessment

## 2023-10-25 NOTE — Op Note (Signed)
 10/25/2023  4:35 PM  Patient:   Krista Morris  Pre-Op Diagnosis:   Impingement/tendinopathy with rotator cuff tear and biceps tendinopathy right shoulder.  Post-Op Diagnosis:   Impingement/tendinopathy with rotator cuff tear, degenerative labral fraying, and biceps tendinopathy, right shoulder.  Procedure:   Extensive arthroscopic debridement, arthroscopic subacromial decompression, mini-open rotator cuff repair, and mini-open biceps tenodesis, right shoulder.  Anesthesia:   General endotracheal with interscalene block using Exparel placed preoperatively by the anesthesiologist.  Surgeon:   Lonnie Roberts, MD  Assistant:   [place] Lee Napolitano, PA-S  Findings:   As above. There was a full-thickness tear involving the entire insertion of the supraspinatus tendon. The remainder of the rotator cuff was in satisfactory condition. There was significant fraying with partial-thickness tearing of the long head of the biceps tendon. The labrum demonstrated moderate fraying superiorly and posterior superiorly without frank detachment from the glenoid rim. The articular surfaces of the glenoid and humerus both were in satisfactory condition.  Complications:   None  Estimated blood loss:   20 cc  Fluids:   400 cc  Tourniquet time:   None  Drains:   None  Closure:   Staples      Brief clinical note:   The patient is a 73 year old female with a history of progressively worsening right shoulder pain. The patient's symptoms have progressed despite medications, activity modification, etc. The patient's history and examination are consistent with impingement/tendinopathy with a rotator cuff tear. These findings were confirmed by MRI scan. The patient presents at this time for definitive management of these shoulder symptoms.  Procedure:   The patient underwent placement of an interscalene block using Exparel by the anesthesiologist in the preoperative holding area before being brought into the  operating room and lain in the supine position. The patient then underwent general endotracheal intubation and anesthesia before being repositioned in the beach chair position using the beach chair positioner. The right shoulder and upper extremity were prepped with ChloraPrep solution before being draped sterilely. Preoperative antibiotics were administered. A timeout was performed to confirm the proper surgical site before the expected portal sites and incision site were injected with 0.5% Sensorcaine with epinephrine.   A posterior portal was created and the glenohumeral joint thoroughly inspected with the findings as described above. An anterior portal was created using an outside-in technique. The labrum and rotator cuff were further probed, again confirming the above-noted findings. The areas of labral fraying were debrided back to stable margins, as were the torn margins of the biceps tendon and supraspinatus tendon. Areas of synovitis also were debrided back to stable margins using the full-radius resector. The ArthroCare wand was inserted and used to obtain hemostasis as well as to "anneal" the labrum superiorly and anteriorly. The instruments were removed from the joint after suctioning the excess fluid.  The camera was repositioned through the posterior portal into the subacromial space. A separate lateral portal was created using an outside-in technique. The 3.5 mm full-radius resector was introduced and used to perform a subtotal bursectomy. The ArthroCare wand was then inserted and used to remove the periosteal tissue off the undersurface of the anterior third of the acromion as well as to recess the coracoacromial ligament from its attachment along the anterior and lateral margins of the acromion. The 4.0 mm acromionizing bur was introduced and used to complete the decompression by removing the undersurface of the anterior third of the acromion. The full radius resector was reintroduced to remove  any residual  bony debris before the ArthroCare wand was reintroduced to obtain hemostasis. The instruments were then removed from the subacromial space after suctioning the excess fluid.  An approximately 4-5 cm incision was made over the anterolateral aspect of the shoulder beginning at the anterolateral corner of the acromion and extending distally in line with the bicipital groove. This incision was carried down through the subcutaneous tissues to expose the deltoid fascia. The raphae between the anterior and middle thirds was identified and this plane developed to provide access into the subacromial space. Additional bursal tissues were debrided sharply using Metzenbaum scissors. The rotator cuff tear was readily identified.   The bicipital groove was identified by palpation and opened for 1-1.5 cm. The biceps tendon stump was retrieved through this defect. The floor of the bicipital groove was roughened with a curet before a Biomet 2.9 mm JuggerKnot anchor was inserted. Both sets of sutures were passed through the biceps tendon and tied securely to effect the tenodesis. The bicipital sheath was reapproximated using two #0 Ethibond interrupted sutures, incorporating the biceps tendon to further reinforce the tenodesis.  The margins of the rotator cuff tear were debrided sharply with a #15 blade and the exposed greater tuberosity roughened with a rongeur. The tear was repaired using two Smith [employer] 2.8 mm Q-Fix anchors. These sutures were then brought back laterally and secured using two Smith & Nephew Healicoil knotless RegeneSorb anchors to create a two-layer closure. An apparent watertight closure was obtained.  The wound was copiously irrigated with sterile saline solution before the deltoid raphae was reapproximated using 2-0 Vicryl interrupted sutures. The subcutaneous tissues were closed in two layers using 2-0 Vicryl interrupted sutures before the skin was closed using staples. The portal sites  also were closed using staples. A sterile bulky dressing was applied to the shoulder before the arm was placed into a shoulder immobilizer. The patient was then awakened, extubated, and returned to the recovery room in satisfactory condition after tolerating the procedure well.

## 2023-10-25 NOTE — Transfer of Care (Signed)
 Immediate Anesthesia Transfer of Care Note  Patient: Krista Morris  Procedure(s) Performed: ARTHROSCOPY, SHOULDER WITH DEBRIDEMENT (Right: Shoulder)  Patient Location: PACU  Anesthesia Type:General  Level of Consciousness: drowsy  Airway & Oxygen Therapy: Patient Spontanous Breathing and Patient connected to face mask oxygen  Post-op Assessment: Report given to RN and Post -op Vital signs reviewed and stable  Post vital signs: Reviewed and stable  Last Vitals:  Vitals Value Taken Time  BP    Temp    Pulse    Resp    SpO2      Last Pain:  Vitals:   10/25/23 1357  TempSrc:   PainSc: Asleep         Complications: There were no known notable events for this encounter.

## 2023-10-25 NOTE — Anesthesia Postprocedure Evaluation (Signed)
 Anesthesia Post Note  Patient: Krista Morris  Procedure(s) Performed: ARTHROSCOPY, SHOULDER WITH DEBRIDEMENT (Right: Shoulder)  Patient location during evaluation: PACU Anesthesia Type: General Level of consciousness: awake and alert, oriented and patient cooperative Pain management: pain level controlled Vital Signs Assessment: post-procedure vital signs reviewed and stable Respiratory status: spontaneous breathing, nonlabored ventilation and respiratory function stable Cardiovascular status: blood pressure returned to baseline and stable Postop Assessment: adequate PO intake Anesthetic complications: no   There were no known notable events for this encounter.   Last Vitals:  Vitals:   10/25/23 1700 10/25/23 1726  BP: (!) 141/84 (!) 156/76  Pulse: 69 66  Resp: 16 17  Temp: (!) 36.2 C (!) 36.1 C  SpO2: 100% 95%    Last Pain:  Vitals:   10/25/23 1726  TempSrc: Temporal  PainSc: 0-No pain                 Aarian Cleaver

## 2023-10-25 NOTE — Discharge Instructions (Addendum)
 Orthopedic discharge instructions: Keep dressing dry and intact.  May shower after dressing changed on post-op day #4 (Sunday).  Cover staples with Band-Aids after drying off. Apply ice frequently to shoulder. Take ibuprofen 600-800 mg TID with meals for 3-5 days, then as necessary. Take oxycodone as prescribed when needed.  May supplement with ES Tylenol if necessary. Keep shoulder immobilizer on at all times except may remove for bathing purposes. Follow-up in 10-14 days or as scheduled.POLAR CARE INFORMATION  MassAdvertisement.it  How to use [employer] Polar Care [facility] Therapy System?  YouTube   ShippingScam.co.uk  OPERATING INSTRUCTIONS  Start the product With dry hands, connect the transformer to the electrical connection located on the top of the cooler. Next, plug the transformer into an appropriate electrical outlet. The unit will automatically start running at this point.  To stop the pump, disconnect electrical power.  Unplug to stop the product when not in use. Unplugging the Polar Care unit turns it off. Always unplug immediately after use. Never leave it plugged in while unattended. Remove pad.    FIRST ADD WATER TO FILL LINE, THEN ICE---Replace ice when existing ice is almost melted  1 Discuss Treatment with your Licensed Health Care Practitioner and Use Only as Prescribed 2 Apply Insulation Barrier & Cold Therapy Pad 3 Check for Moisture 4 Inspect Skin Regularly  Tips and Trouble Shooting Usage Tips 1. Use cubed or chunked ice for optimal performance. 2. It is recommended to drain the Pad between uses. To drain the pad, hold the Pad upright with the hose pointed toward the ground. Depress the black plunger and allow water to drain out. 3. You may disconnect the Pad from the unit without removing the pad from the affected area by depressing the silver tabs on the hose coupling and gently pulling the hoses apart. The Pad and unit will seal itself and  will not leak. Note: Some dripping during release is normal. 4. DO NOT RUN PUMP WITHOUT WATER! The pump in this unit is designed to run with water. Running the unit without water will cause permanent damage to the pump. 5. Unplug unit before removing lid.  TROUBLESHOOTING GUIDE Pump not running, Water not flowing to the pad, Pad is not getting cold 1. Make sure the transformer is plugged into the wall outlet. 2. Confirm that the ice and water are filled to the indicated levels. 3. Make sure there are no kinks in the pad. 4. Gently pull on the blue tube to make sure the tube/pad junction is straight. 5. Remove the pad from the treatment site and ll it while the pad is lying at; then reapply. 6. Confirm that the pad couplings are securely attached to the unit. Listen for the double clicks (Figure 1) to confirm the pad couplings are securely attached.  Leaks    Note: Some condensation on the lines, controller, and pads is unavoidable, especially in warmer climates. 1. If using a [employer] Polar Care Cold Therapy unit with a detachable Cold Therapy Pad, and a leak exists (other than condensation on the lines) disconnect the pad couplings. Make sure the silver tabs on the couplings are depressed before reconnecting the pad to the pump hose; then confirm both sides of the coupling are properly clicked in. 2. If the coupling continues to leak or a leak is detected in the pad itself, stop using it and call [employer] Customer Care at [phone].  Cleaning After use, empty and dry the unit with a soft cloth. Warm  water and mild detergent may be used occasionally to clean the pump and tubes.  WARNING: The Polar Care Cube can be cold enough to cause serious injury, including full skin necrosis. Follow these Operating Instructions, and carefully read the Product Insert (see pouch on side of unit) and the Cold Therapy Pad Fitting Instructions (provided with each Cold Therapy Pad) prior to use.       SHOULDER  SLING IMMOBILIZER   VIDEO Slingshot 2 Shoulder Brace Application - YouTube ---https://www.porter.info/  INSTRUCTIONS While supporting the injured arm, slide the forearm into the sling. Wrap the adjustable shoulder strap around the neck and shoulders and attach the strap end to the sling using  the "alligator strap tab."  Adjust the shoulder strap to the required length. Position the shoulder pad behind the neck. To secure the shoulder pad location (optional), pull the shoulder strap away from the shoulder pad, unfold the hook material on the top of the pad, then press the shoulder strap back onto the hook material to secure the pad in place. Attach the closure strap across the open top of the sling. Position the strap so that it holds the arm securely in the sling. Next, attach the thumb strap to the open end of the sling between the thumb and fingers. After sling has been fit, it may be easily removed and reapplied using the quick release buckle on shoulder strap. If a neutral pillow or 15 abduction pillow is included, place the pillow at the waistline. Attach the sling to the pillow, lining up hook material on the pillow with the loop on sling. Adjust the waist strap to fit.  If waist strap is too long, cut it to fit. Use the small piece of double sided hook material (located on top of the pillow) to secure the strap end. Place the double sided hook material on the inside of the cut strap end and secure it to the waist strap.     If no pillow is included, attach the waist strap to the sling and adjust to fit.    Washing Instructions: Straps and sling must be removed and cleaned regularly depending on your activity level and perspiration. Hand wash straps and sling in cold water with mild detergent, rinse, air dry      Interscalene Nerve Block with Exparel   For your surgery you have received an Interscalene Nerve Block with Exparel. Nerve Blocks affect many types of  nerves, including nerves that control movement, pain and normal sensation.  You may experience feelings such as numbness, tingling, heaviness, weakness or the inability to move your arm or the feeling or sensation that your arm has "fallen asleep". A nerve block with Exparel can last up to 5 days.  Usually the weakness wears off first.  The tingling and heaviness usually wear off next.  Finally you may start to notice pain.  Keep in mind that this may occur in any order.  Once a nerve block starts to wear off it is usually completely gone within 60 minutes. ISNB may cause mild shortness of breath, a hoarse voice, blurry vision, unequal pupils, or drooping of the face on the same side as the nerve block.  These symptoms will usually resolve with the numbness.  Very rarely the procedure itself can cause mild seizures. If needed, your surgeon will give you a prescription for pain medication.  It will take about 60 minutes for the oral pain medication to become fully effective.  So, it is recommended  that you start taking this medication before the nerve block first begins to wear off, or when you first begin to feel discomfort. Take your pain medication only as prescribed.  Pain medication can cause sedation and decrease your breathing if you take more than you need for the level of pain that you have. Nausea is a common side effect of many pain medications.  You may want to eat something before taking your pain medicine to prevent nausea. After an Interscalene nerve block, you cannot feel pain, pressure or extremes in temperature in the effected arm.  Because your arm is numb it is at an increased risk for injury.  To decrease the possibility of injury, please practice the following:  While you are awake change the position of your arm frequently to prevent too much pressure on any one area for prolonged periods of time.  If you have a cast or tight dressing, check the color or your fingers every couple of  hours.  Call your surgeon with the appearance of any discoloration (white or blue). If you are given a sling to wear before you go home, please wear it  at all times until the block has completely worn off.  Do not get up at night without your sling. Please contact [facility] or your surgeon if you do not begin to regain sensation after 7 days from the surgery.  Anesthesia may be contacted by calling the Same Day Surgery Department, Mon. through Fri., 6 am to 4 pm at [phone].   If you experience any other problems or concerns, please contact your surgeon's office. If you experience severe or prolonged shortness of breath go to the nearest emergency department.

## 2023-10-25 NOTE — Anesthesia Preprocedure Evaluation (Addendum)
 Anesthesia Evaluation  Patient identified by MRN, date of birth, ID band Patient awake    Reviewed: Allergy & Precautions, NPO status , Patient's Chart, lab work & pertinent test results  Airway Mallampati: II  TM Distance: >3 FB Neck ROM: full    Dental  (+) Chipped, Dental Advidsory Given   Pulmonary neg pulmonary ROS   Pulmonary exam normal        Cardiovascular hypertension, On Medications Normal cardiovascular exam+ Valvular Problems/Murmurs      Neuro/Psych negative neurological ROS  negative psych ROS   GI/Hepatic negative GI ROS, Neg liver ROS,,,  Endo/Other  diabetes    Renal/GU negative Renal ROS  negative genitourinary   Musculoskeletal   Abdominal   Peds  Hematology negative hematology ROS (+)   Anesthesia Other Findings Discussed a nerve block with the patient for the post op pain. Explained benefits and risks. Patient declined nerve block.  After discussion with the surgeon, patient decided to go forward with nerve block.   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 No date: Heart murmur No date: Hemorrhoids No date: Hypertension No date: Nontraumatic complete tear of right rotator cuff No date: Obesity (BMI 30.0-34.9) No date: Porokeratosis No date: Rotator cuff tendinitis No date: Type 2 diabetes mellitus (HCC)  Past Surgical History: 2012: bone spur removal     Comment:  from foot 1982, 1985: CESAREAN SECTION     Comment:  Dr Buena Carmine 2004, 2015: COLONOSCOPY     Comment:  hemorrhoids in 2004 and in 2015 normal screening 2000, 2002: ENDOMETRIAL BIOPSY     Comment:  disordered proliferative; weakly proliferative No date: PRE-MALIGNANT / BENIGN SKIN LESION EXCISION     Comment:  right thigh; ulcerated fibroepithelial polyp 1990: SALPINGECTOMY; Right     Comment:  ectopic  No date: TONSILLECTOMY No date: TUBAL  LIGATION     Reproductive/Obstetrics negative OB ROS                             Anesthesia Physical Anesthesia Plan  ASA: 2  Anesthesia Plan: General ETT   Post-op Pain Management: Regional block*   Induction: Intravenous  PONV Risk Score and Plan: 3 and Ondansetron, Dexamethasone and Midazolam  Airway Management Planned: Oral ETT  Additional Equipment:   Intra-op Plan:   Post-operative Plan: Extubation in OR  Informed Consent: I have reviewed the patients History and Physical, chart, labs and discussed the procedure including the risks, benefits and alternatives for the proposed anesthesia with the patient or authorized representative who has indicated his/her understanding and acceptance.     Dental Advisory Given  Plan Discussed with: Anesthesiologist, CRNA and Surgeon  Anesthesia Plan Comments: (Patient consented for risks of anesthesia including but not limited to:  - adverse reactions to medications - damage to eyes, teeth, lips or other oral mucosa - nerve damage due to positioning  - sore throat or hoarseness - Damage to heart, brain, nerves, lungs, other parts of body or loss of life  Patient voiced understanding and assent.)       Anesthesia Quick Evaluation

## 2023-10-25 NOTE — H&P (Signed)
 History of Present Illness:  Krista Morris is a 73 y.o. female who presents today as a result of a call to our clinic for right shoulder pain.   The patient's symptoms began about a year ago and developed without any specific cause or injury. However, her symptoms worsened about 8 months ago when she tripped on an uneven portion of the sidewalk and fell forward onto her right side, injuring/aggravating her right shoulder. She saw Dr. Windle Hatch in October for the symptoms. He sent her for an MRI scan to evaluate for a rotator cuff tear. However, the patient had difficulty making a follow-up appointment and is only following up now for the results of her MRI scan. The patient continues to note moderate pain in her shoulder which she rates at 6/10 on today's visit. The patient describes the symptoms as moderate (patient is active but has had to make modifications or give up activities) and have the quality of being aching, miserable, nagging, stabbing, tender, and throbbing. The pain is localized to the lateral arm/shoulder. These symptoms are aggravated with normal daily activities, with sleeping, carrying heavy objects, at higher levels of activity, with overhead activity, reaching behind the back, and getting dressed. She has tried acetaminophen and non-steroidal anti-inflammatories (Aleve and Mobic) with limited benefit. She has tried rest and activity modification with temporary partial relief. She has not tried any steroid injections or formal physical therapy for these symptoms. The patient denies any neck pain, noted she note any numbness or paresthesias down her arm to her hand. She is right-hand dominant. This complaint is not work related. She is a sports non-participant.  Shoulder Surgical History:  The patient has had no shoulder surgery in the past.  PMH/PSH/Family History/Social History/Meds/Allergies:  I have reviewed past medical, surgical, social and family history, medications and allergies  as documented in the EMR.  Current Outpatient Medications:  calcipotriene -betamethasone  (TACLONEX SCALP) topical suspension Apply topically to ears daily up to 2 weeks at a time as needed.  fluocinolone  acetonide (DERMOTIC ) 0.01 % otic drop Apply to ears once a day as needed.  VTAMA  1 % Crea Apply to ears once a day.  amLODIPine (NORVASC) 10 MG tablet TAKE 1 TABLET EVERY DAY 90 tablet 3  aspirin 81 MG EC tablet Take 81 mg by mouth once daily  cetirizine (ZYRTEC) 10 MG tablet Take 1 tablet (10 mg total) by mouth once daily  cholecalciferol (VITAMIN D3) 1,000 unit capsule Take 1,000 Units by mouth once daily 2 tablets a day  desoximetasone  (TOPICORT ) 0.25 % cream Apply to affected areas on body twice daily as needed for itch,Avoid applying to face, groin, and axilla  hydroCHLOROthiazide (HYDRODIURIL) 25 MG tablet Take 1 tablet (25 mg total) by mouth once daily 90 tablet 3  hydrocortisone  2.5 % cream Mix hydrocortisone  with ketaconazole 2% twice a day. If improved, decrease to hydrocortisone  and ketaconazole mixed once a day. If still clear, decrease to ketaconazole only  ketoconazole  (NIZORAL ) 2 % cream Mix hydrocortisone  with ketaconazole 2% twice a day. If improved, decrease to hydrocortisone  and ketaconazole mixed once a day. If still clear, decrease to ketaconazole only  losartan (COZAAR) 50 MG tablet TAKE 1 TABLET EVERY DAY 90 tablet 3  meloxicam (MOBIC) 15 MG tablet As needed  metoprolol tartrate (LOPRESSOR) 50 MG tablet TAKE 1 AND 1/2 TABLETS TWICE DAILY 270 tablet 3  mometasone  (ELOCON ) 0.1 % lotion Start Mometasone  lotion, apply to affected area in ears twice daily per itch  triamcinolone 0.1 % cream APPLY  TO AFFECTED AREA TWICE A DAY 30 g 0   Allergies:  Flagyl [Metronidazole Hcl] Unknown   Past Medical History:  Arthritis  Chickenpox  Ectopic pregnancy, tubal (HHS-HCC)  Hemorrhoids  Hypertension  New onset type 2 diabetes mellitus (CMS/HHS-HCC) 09/05/2022  Pericarditis (HHS-HCC)  - viral 2010   Past Surgical History:  COLONOSCOPY 05/23/2013  CESAREAN SECTION x 3  TONSILLECTOMY  TUBAL LIGATION   Family History:  High blood pressure (Hypertension) Mother Ivin Marrow  Aneurysm Mother Ivin Marrow  Diabetes type II Father James  High blood pressure (Hypertension) Father Royston Cornea  Coronary Artery Disease (Blocked arteries around heart) Brother James  Heart attack. Congestive heart failure.   Social History:   Socioeconomic History:  Marital status: Married  Tobacco Use  Smoking status: Never  Smokeless tobacco: Never  Substance and Sexual Activity  Alcohol use: Never  Drug use: Never  Sexual activity: Yes  Partners: Male  Birth control/protection: Post-menopausal  Comment: Tubal ligation  Other Topics Concern  Would you please tell us  about the people who live in your home, your pets, or anything else important to your social life? No   Social Drivers of Health:   Physicist, medical Strain: Low Risk (09/06/2023)  Overall Financial Resource Strain (CARDIA)  Difficulty of Paying Living Expenses: Not hard at all  Food Insecurity: No Food Insecurity (09/06/2023)  Hunger Vital Sign  Worried About Running Out of Food in the Last Year: Never true  Ran Out of Food in the Last Year: Never true  Transportation Needs: No Transportation Needs (09/06/2023)  PRAPARE - Risk analyst (Medical): No  Lack of Transportation (Non-Medical): No   Review of Systems:  A comprehensive 14 point ROS was performed, reviewed, and the pertinent orthopaedic findings are documented in the HPI.  Physical Exam:  Vitals:  09/22/23 0914  BP: 120/80  Weight: 90.4 kg (199 lb 6.4 oz)  Height: 162.6 cm (5\' 4" )  PainSc: 6  PainLoc: Shoulder   General/Constitutional: The patient appears to be well-nourished, well-developed, and in no acute distress. Neuro/Psych: Normal mood and affect, oriented to person, place and time. Eyes: Non-icteric. Pupils are equal, round, and  reactive to light, and exhibit synchronous movement. ENT: Unremarkable. Lymphatic: No palpable adenopathy. Respiratory: Lungs clear to auscultation, Normal chest excursion, No wheezes, and Non-labored breathing Cardiovascular: Regular rate and rhythm. No murmurs. and No edema, swelling or tenderness, except as noted in detailed exam. Integumentary: No impressive skin lesions present, except as noted in detailed exam. Musculoskeletal: Unremarkable, except as noted in detailed exam.  Right shoulder exam: SKIN: normal SWELLING: none WARMTH: none LYMPH NODES: no adenopathy palpable CREPITUS: none TENDERNESS: Mildly tender along lateral acromion ROM (active):  Forward flexion: 95 degrees Abduction: 90 degrees Internal rotation: L3 ROM (passive):  Forward flexion: 115 degrees Abduction: 100 degrees  ER/IR at 90 abd: 70 degrees / 40 degrees  She experiences moderate to severe pain with all motions.  STRENGTH: Forward flexion: 4/5 Abduction: 4/5 External rotation: 4-4+/5 Internal rotation: 4+/5 Pain with RC testing: Moderate pain with resisted forward flexion and abduction, and mild pain with resisted external rotation.  STABILITY: Normal  SPECIAL TESTS: Zoila Hines' test: positive, moderate Speed's test: positive Capsulitis - pain w/ passive ER: no Crossed arm test: Mildly positive Crank: Not evaluated Anterior apprehension: Negative Posterior apprehension: Not evaluated  She is neurovascularly intact to the right upper extremity.  Imaging:  Shoulder X-Ray Imaging: Recent true AP, Y-scapular, and axillary views of the right shoulder are available for review and  have been reviewed by myself. These films demonstrate no evidence for fractures, lytic lesions, or significant degenerative changes. The subacromial space is mildly decreased. There is no subacromial or infra-clavicular spurring, but she does exhibit a laterally downsloping acromion. She demonstrates a Type II  acromion.  Right Shoulder Imaging, MRI: MRI Shoulder Cartilage: No cartilage abnormality. MRI Shoulder Rotator Cuff: Full thickness tear of the supraspinatus. Retracted to the humeral head. MRI Shoulder Labrum / Biceps: Biceps tendinopathy. MRI Shoulder Bone: Normal bone.  Both the films and report were reviewed by myself and discussed with the patient.  Assessment:  1. Nontraumatic complete tear of right rotator cuff.  2. Rotator cuff tendinitis, right.  3. Tendinitis of upper biceps tendon of right shoulder.   Plan:  The treatment options were discussed with the patient. In addition, patient educational materials were provided regarding the diagnosis and treatment options. The patient is quite frustrated by her symptoms and functional limitations, and is ready to consider more aggressive treatment options. Therefore, I have recommended a surgical procedure, specifically a right shoulder arthroscopy with debridement, decompression, rotator cuff repair, and biceps tenodesis. The procedure was discussed with the patient, as were the potential risks (including bleeding, infection, nerve and/or blood vessel injury, persistent or recurrent pain, failure of the repair, progression of arthritis, need for further surgery, blood clots, strokes, heart attacks and/or arhythmias, pneumonia, etc.) and benefits. The patient states her understanding and wishes to proceed. All of the patient's questions and concerns were answered. She can call any time with further concerns. She will follow up post-surgery, routine.    H&P reviewed and patient re-examined. No changes.

## 2023-10-26 ENCOUNTER — Encounter: Payer: Self-pay | Admitting: Surgery

## 2023-12-14 ENCOUNTER — Encounter: Payer: Self-pay | Admitting: Internal Medicine

## 2023-12-27 ENCOUNTER — Ambulatory Visit
Admission: RE | Admit: 2023-12-27 | Discharge: 2023-12-27 | Disposition: A | Discharge status: Home / Self Care | Attending: Internal Medicine | Admitting: Internal Medicine

## 2023-12-27 ENCOUNTER — Ambulatory Visit: Admitting: General Practice

## 2023-12-27 ENCOUNTER — Encounter: Payer: Self-pay | Admitting: Internal Medicine

## 2023-12-27 ENCOUNTER — Encounter
Admission: RE | Disposition: A | Discharge status: Home / Self Care | Payer: Self-pay | Source: Home / Self Care | Attending: Internal Medicine

## 2023-12-27 DIAGNOSIS — I10 Essential (primary) hypertension: Secondary | ICD-10-CM | POA: Insufficient documentation

## 2023-12-27 DIAGNOSIS — K640 First degree hemorrhoids: Secondary | ICD-10-CM | POA: Insufficient documentation

## 2023-12-27 DIAGNOSIS — E119 Type 2 diabetes mellitus without complications: Secondary | ICD-10-CM | POA: Insufficient documentation

## 2023-12-27 DIAGNOSIS — K580 Irritable bowel syndrome with diarrhea: Secondary | ICD-10-CM | POA: Diagnosis not present

## 2023-12-27 DIAGNOSIS — Z1211 Encounter for screening for malignant neoplasm of colon: Secondary | ICD-10-CM | POA: Diagnosis present

## 2023-12-27 HISTORY — PX: COLONOSCOPY: SHX5424

## 2023-12-27 HISTORY — DX: Disease of pericardium, unspecified: I31.9

## 2023-12-27 HISTORY — DX: Osteoarthritis of knee, unspecified: M17.9

## 2023-12-27 HISTORY — DX: Other articular cartilage disorders, right shoulder: M24.111

## 2023-12-27 SURGERY — COLONOSCOPY
Anesthesia: General

## 2023-12-27 MED ORDER — PROPOFOL 500 MG/50ML IV EMUL
INTRAVENOUS | Status: DC | PRN
  Administered 2023-12-27: 75 ug/kg/min via INTRAVENOUS

## 2023-12-27 MED ORDER — DEXMEDETOMIDINE HCL IN NACL 80 MCG/20ML IV SOLN
INTRAVENOUS | Status: DC | PRN
  Administered 2023-12-27: 8 ug via INTRAVENOUS
  Administered 2023-12-27: 12 ug via INTRAVENOUS

## 2023-12-27 MED ORDER — SODIUM CHLORIDE 0.9 % IV SOLN
INTRAVENOUS | Status: DC
  Administered 2023-12-27: 20 mL/h via INTRAVENOUS

## 2023-12-27 MED ORDER — LIDOCAINE HCL (CARDIAC) PF 100 MG/5ML IV SOSY
PREFILLED_SYRINGE | INTRAVENOUS | Status: DC | PRN
  Administered 2023-12-27: 80 mg via INTRAVENOUS

## 2023-12-27 MED ORDER — PROPOFOL 10 MG/ML IV BOLUS
INTRAVENOUS | Status: DC | PRN
  Administered 2023-12-27: 50 mg via INTRAVENOUS
  Administered 2023-12-27: 30 mg via INTRAVENOUS

## 2023-12-27 NOTE — H&P (Signed)
 Outpatient short stay form Pre-procedure 12/27/2023 12:47 PM Krista Morris, M.D.  Primary Physician: Krista Morris, M.D.  Reason for visit:  Colon cancer screening  History of present illness:  Krista Morris presents to the [facility] clinic to discuss scheduling repeat screening colonoscopy. She had screening colonoscopy performed by Dr. Viktoria Jan 2015 which showed sigmoid diverticulosis and otherwise normal examined colon. No known family history of colorectal cancer or advanced adenomas. She has hx of IBS with diarrhea. She can still struggle with intermittent abdominal cramping and diarrhea. She does her best to avoid triggers in her diet. She tries to monitor greasy foods and moods with MSG. She uses loperamide as needed. She denies any issues with hematochezia or melena. No major changes in her bowel habits. She is not currently having any abdominal pain. Appetite and diet are stable without any unintentional weight loss. She denies any issues with nausea, vomiting, dysphagia, odynophagia, early satiety, hoarseness, or epigastric abdominal pain. She is scheduled to have rotator cuff surgery on June 4th. She is curious if she can get her screening colonoscopy done before her surgery.      Current Facility-Administered Medications:    0.9 %  sodium chloride  infusion, , Intravenous, Continuous, Gilman Olazabal K, MD  Medications Prior to Admission  Medication Sig Dispense Refill Last Dose/Taking   amLODipine (NORVASC) 10 MG tablet Take 10 mg by mouth every morning.  2 12/27/2023 at  6:00 AM   aspirin 81 MG EC tablet Take 81 mg by mouth daily.   12/26/2023   calcipotriene -betamethasone  (TACLONEX) external suspension Apply topically to ears daily up to 2 weeks at a time as needed. 60 g 2 12/26/2023   cetirizine (ZYRTEC) 10 MG tablet Take 10 mg by mouth daily as needed for allergies.   12/26/2023   Cholecalciferol (VITAMIN D3) 1000 UNITS CAPS Take 1,000 Units by mouth daily.   12/26/2023    Fluocinolone  Acetonide 0.01 % OIL Apply to ears once a day as needed. 20 mL 5 12/26/2023   hydrochlorothiazide (HYDRODIURIL) 25 MG tablet Take 12.5 mg by mouth daily.   12/26/2023   hydrocortisone  2.5 % cream Mix hydrocortisone  with ketaconazole 2% twice a day. If improved, decrease to hydrocortisone  and ketaconazole mixed once a day. If still clear, decrease to ketaconazole only 28 g 3 12/26/2023   ketoconazole  (NIZORAL ) 2 % cream Apply to skin folds once to twice daily as directed. (Patient taking differently: Apply 1 Application topically 2 (two) times daily as needed for irritation.) 60 g 5 12/26/2023   losartan (COZAAR) 50 MG tablet Take 50 mg by mouth every morning.  3 12/27/2023 at  6:00 AM   metoprolol tartrate (LOPRESSOR) 50 MG tablet Take 75 mg by mouth 2 (two) times daily.   12/27/2023 at  6:00 AM   oxyCODONE  (ROXICODONE ) 5 MG immediate release tablet Take 1-2 tablets (5-10 mg total) by mouth every 4 (four) hours as needed for moderate pain (pain score 4-6) or severe pain (pain score 7-10). 40 tablet 0 12/26/2023   Tapinarof  (VTAMA ) 1 % CREA Apply to ears once a day. 60 g 2 12/26/2023     Allergies  Allergen Reactions   Metronidazole Itching     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    Eczema of external ear    Heart murmur    Hemorrhoids    Hypertension  Nontraumatic complete tear of right rotator cuff    Obesity (BMI 30.0-34.9)    Osteoarthritis of knee    Pericarditis    Porokeratosis    Rotator cuff tendinitis    Type 2 diabetes mellitus (HCC)     Review of systems:  Otherwise negative.    Physical Exam  Gen: Alert, oriented. Appears stated age.  HEENT: [place]/AT. PERRLA. Lungs: CTA, no wheezes. CV: RR nl S1, S2. Abd: soft, benign, no masses. BS+ Ext: No edema. Pulses 2+    Planned procedures: Proceed with colonoscopy. The patient understands the nature of the planned  procedure, indications, risks, alternatives and potential complications including but not limited to bleeding, infection, perforation, damage to internal organs and possible oversedation/side effects from anesthesia. The patient agrees and gives consent to proceed.  Please refer to procedure notes for findings, recommendations and patient disposition/instructions.     Zulay Corrie K. Morris, M.D. Gastroenterology 12/27/2023  12:47 PM

## 2023-12-27 NOTE — Op Note (Signed)
 [facility] Gastroenterology Patient Name: Krista Morris Procedure Date: 12/27/2023 12:48 PM MRN: [id number] Account #: [id number] Date of Birth: 10/08/1950 Admit Type: Outpatient Age: 73 Room: [facility] ENDO ROOM 2 Gender: Female Note Status: Finalized Instrument Name: Peds Colonoscope [id number] Procedure:             Colonoscopy Indications:           Screening for colorectal malignant neoplasm Providers:             Aleksis Jiggetts K. Aundria MD, MD Referring MD:          Lynwood FALCON. Valora, MD (Referring MD) Medicines:             Propofol  per Anesthesia Complications:         No immediate complications. Estimated blood loss: None. Procedure:             Pre-Anesthesia Assessment:                        - The risks and benefits of the procedure and the                         sedation options and risks were discussed with the                         patient. All questions were answered and informed                         consent was obtained.                        - Patient identification and proposed procedure were                         verified prior to the procedure by the nurse. The                         procedure was verified in the procedure room.                        - ASA Grade Assessment: III - A patient with severe                         systemic disease.                        - After reviewing the risks and benefits, the patient                         was deemed in satisfactory condition to undergo the                         procedure.                        After obtaining informed consent, the colonoscope was                         passed under direct vision. Throughout the procedure,  the patient's blood pressure, pulse, and oxygen                         saturations were monitored continuously. The                         Colonoscope was introduced through the anus and                         advanced to the the cecum,  identified by appendiceal                         orifice and ileocecal valve. The colonoscopy was                         performed without difficulty. The patient tolerated                         the procedure well. The quality of the bowel                         preparation was good. The ileocecal valve, appendiceal                         orifice, and rectum were photographed. Findings:      The perianal and digital rectal examinations were normal. Pertinent       negatives include normal sphincter tone and no palpable rectal lesions.      Non-bleeding internal hemorrhoids were found during retroflexion. The       hemorrhoids were Grade I (internal hemorrhoids that do not prolapse).      The exam was otherwise without abnormality. Impression:            - Non-bleeding internal hemorrhoids.                        - The examination was otherwise normal.                        - No specimens collected. Recommendation:        - Patient has a contact number available for                         emergencies. The signs and symptoms of potential                         delayed complications were discussed with the patient.                         Return to normal activities tomorrow. Written                         discharge instructions were provided to the patient.                        - Resume previous diet.                        - Continue present medications.                        -  You do NOT require further colon cancer screening                         measures (Annual stool testing (i.e. hemoccult, FIT,                         cologuard), sigmoidoscopy, colonoscopy or CT                         colonography). You should share this recommendation                         with your Primary Care provider.                        - Return to GI office PRN.                        - The findings and recommendations were discussed with                         the patient. Procedure  Code(s):     --- Professional ---                        H9878, Colorectal cancer screening; colonoscopy on                         individual not meeting criteria for high risk Diagnosis Code(s):     --- Professional ---                        K64.0, First degree hemorrhoids                        Z12.11, Encounter for screening for malignant neoplasm                         of colon CPT copyright 2022 American Medical Association. All rights reserved. The codes documented in this report are preliminary and upon coder review may  be revised to meet current compliance requirements. Ladell MARLA Boss MD, MD 12/27/2023 1:17:14 PM This report has been signed electronically. Number of Addenda: 0 Note Initiated On: 12/27/2023 12:48 PM Scope Withdrawal Time: 0 hours 6 minutes 32 seconds  Total Procedure Duration: 0 hours 9 minutes 1 second  Estimated Blood Loss:  Estimated blood loss: none.      [facility]

## 2023-12-27 NOTE — Anesthesia Preprocedure Evaluation (Addendum)
 Anesthesia Evaluation  Patient identified by MRN, date of birth, ID band Patient awake    Reviewed: Allergy & Precautions, NPO status , Patient's Chart, lab work & pertinent test results  Airway Mallampati: II  TM Distance: >3 FB Neck ROM: full    Dental  (+) Chipped, Dental Advidsory Given   Pulmonary neg pulmonary ROS   Pulmonary exam normal        Cardiovascular hypertension, On Medications Normal cardiovascular exam+ Valvular Problems/Murmurs      Neuro/Psych negative neurological ROS  negative psych ROS   GI/Hepatic negative GI ROS, Neg liver ROS,,,  Endo/Other  diabetes    Renal/GU      Musculoskeletal   Abdominal   Peds  Hematology negative hematology ROS (+)   Anesthesia Other Findings 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 No date: Heart murmur No date: Hemorrhoids No date: Hypertension No date: Nontraumatic complete tear of right rotator cuff No date: Obesity (BMI 30.0-34.9) No date: Porokeratosis No date: Rotator cuff tendinitis No date: Type 2 diabetes mellitus (HCC)  Past Surgical History: 2012: bone spur removal     Comment:  from foot 1982, 1985: CESAREAN SECTION     Comment:  Dr Elwin 2004, 2015: COLONOSCOPY     Comment:  hemorrhoids in 2004 and in 2015 normal screening 2000, 2002: ENDOMETRIAL BIOPSY     Comment:  disordered proliferative; weakly proliferative No date: PRE-MALIGNANT / BENIGN SKIN LESION EXCISION     Comment:  right thigh; ulcerated fibroepithelial polyp 1990: SALPINGECTOMY; Right     Comment:  ectopic  No date: TONSILLECTOMY No date: TUBAL LIGATION     Reproductive/Obstetrics negative OB ROS                              Anesthesia Physical Anesthesia Plan  ASA: 3  Anesthesia Plan: General   Post-op Pain Management: Minimal or no pain anticipated    Induction: Intravenous  PONV Risk Score and Plan: 2 and Propofol  infusion and TIVA  Airway Management Planned: Nasal Cannula  Additional Equipment: None  Intra-op Plan:   Post-operative Plan:   Informed Consent: I have reviewed the patients History and Physical, chart, labs and discussed the procedure including the risks, benefits and alternatives for the proposed anesthesia with the patient or authorized representative who has indicated his/her understanding and acceptance.     Dental advisory given  Plan Discussed with: CRNA and Surgeon  Anesthesia Plan Comments: (Discussed risks of anesthesia with patient, including possibility of difficulty with spontaneous ventilation under anesthesia necessitating airway intervention, PONV, and rare risks such as cardiac or respiratory or neurological events, and allergic reactions. Discussed the role of CRNA in patient's perioperative care. Patient understands.)         Anesthesia Quick Evaluation

## 2023-12-27 NOTE — Interval H&P Note (Signed)
 History and Physical Interval Note:  12/27/2023 12:48 PM  Krista Morris  has presented today for surgery, with the diagnosis of Colon cancer screening (Z12.11).  The various methods of treatment have been discussed with the patient and family. After consideration of risks, benefits and other options for treatment, the patient has consented to  Procedure(s): COLONOSCOPY (N/A) as a surgical intervention.  The patient's history has been reviewed, patient examined, no change in status, stable for surgery.  I have reviewed the patient's chart and labs.  Questions were answered to the patient's satisfaction.     [name], [name]

## 2023-12-27 NOTE — Transfer of Care (Signed)
 Immediate Anesthesia Transfer of Care Note  Patient: Krista Morris  Procedure(s) Performed: COLONOSCOPY  Patient Location: PACU  Anesthesia Type:General  Level of Consciousness: sedated  Airway & Oxygen Therapy: Patient Spontanous Breathing  Post-op Assessment: Report given to RN and Post -op Vital signs reviewed and stable  Post vital signs: Reviewed and stable  Last Vitals:  Vitals Value Taken Time  BP 95/55 12/27/23 13:21  Temp    Pulse 62 12/27/23 13:21  Resp 21 12/27/23 13:21  SpO2 100 % 12/27/23 13:21    Last Pain:  Vitals:   12/27/23 1232  TempSrc: Temporal  PainSc: 6          Complications: No notable events documented.

## 2023-12-28 NOTE — Anesthesia Postprocedure Evaluation (Signed)
 Anesthesia Post Note  Patient: NISHITA ISAACKS  Procedure(s) Performed: COLONOSCOPY  Patient location during evaluation: Endoscopy Anesthesia Type: General Level of consciousness: awake and alert Pain management: pain level controlled Vital Signs Assessment: post-procedure vital signs reviewed and stable Respiratory status: spontaneous breathing, nonlabored ventilation, respiratory function stable and patient connected to nasal cannula oxygen Cardiovascular status: blood pressure returned to baseline and stable Postop Assessment: no apparent nausea or vomiting Anesthetic complications: no   No notable events documented.   Last Vitals:  Vitals:   12/27/23 1342 12/27/23 1350  BP: 127/61 (!) 152/69  Pulse: (!) 55 (!) 50  Resp: 20 16  Temp:    SpO2: 100% 100%    Last Pain:  Vitals:   12/28/23 0724  TempSrc:   PainSc: 0-No pain                 Debby Mines

## 2024-01-25 ENCOUNTER — Other Ambulatory Visit: Payer: Self-pay

## 2024-01-25 ENCOUNTER — Encounter: Payer: Self-pay | Admitting: Emergency Medicine

## 2024-01-25 ENCOUNTER — Emergency Department
Admission: EM | Admit: 2024-01-25 | Discharge: 2024-01-25 | Disposition: A | Discharge status: Home / Self Care | Attending: Emergency Medicine | Admitting: Emergency Medicine

## 2024-01-25 ENCOUNTER — Emergency Department

## 2024-01-25 DIAGNOSIS — I700 Atherosclerosis of aorta: Secondary | ICD-10-CM | POA: Insufficient documentation

## 2024-01-25 DIAGNOSIS — K769 Liver disease, unspecified: Secondary | ICD-10-CM | POA: Diagnosis not present

## 2024-01-25 DIAGNOSIS — I10 Essential (primary) hypertension: Secondary | ICD-10-CM | POA: Diagnosis not present

## 2024-01-25 DIAGNOSIS — K5730 Diverticulosis of large intestine without perforation or abscess without bleeding: Secondary | ICD-10-CM | POA: Insufficient documentation

## 2024-01-25 DIAGNOSIS — R011 Cardiac murmur, unspecified: Secondary | ICD-10-CM | POA: Diagnosis not present

## 2024-01-25 DIAGNOSIS — R109 Unspecified abdominal pain: Secondary | ICD-10-CM | POA: Diagnosis not present

## 2024-01-25 DIAGNOSIS — R197 Diarrhea, unspecified: Secondary | ICD-10-CM | POA: Insufficient documentation

## 2024-01-25 LAB — URINALYSIS, ROUTINE W REFLEX MICROSCOPIC
Bacteria, UA: NONE SEEN
Bilirubin Urine: NEGATIVE
Glucose, UA: NEGATIVE mg/dL
Ketones, ur: NEGATIVE mg/dL
Leukocytes,Ua: NEGATIVE
Nitrite: NEGATIVE
Protein, ur: NEGATIVE mg/dL
Specific Gravity, Urine: 1.024 (ref 1.005–1.030)
pH: 7 (ref 5.0–8.0)

## 2024-01-25 LAB — CBC
HCT: 38.1 % (ref 36.0–46.0)
Hemoglobin: 12.5 g/dL (ref 12.0–15.0)
MCH: 26.9 pg (ref 26.0–34.0)
MCHC: 32.8 g/dL (ref 30.0–36.0)
MCV: 82.1 fL (ref 80.0–100.0)
Platelets: 208 K/uL (ref 150–400)
RBC: 4.64 MIL/uL (ref 3.87–5.11)
RDW: 15.2 % (ref 11.5–15.5)
WBC: 7.3 K/uL (ref 4.0–10.5)
nRBC: 0 % (ref 0.0–0.2)

## 2024-01-25 LAB — CLOSTRIDIUM DIFFICILE BY PCR, REFLEXED
Hypervirulent Strain: NEGATIVE
Toxigenic C. Difficile by PCR: POSITIVE — AB

## 2024-01-25 LAB — LIPASE, BLOOD: Lipase: 23 U/L (ref 11–51)

## 2024-01-25 LAB — GASTROINTESTINAL PANEL BY PCR, STOOL (REPLACES STOOL CULTURE)

## 2024-01-25 LAB — COMPREHENSIVE METABOLIC PANEL WITH GFR
ALT: 10 U/L (ref 0–44)
AST: 16 U/L (ref 15–41)
Albumin: 3.9 g/dL (ref 3.5–5.0)
Alkaline Phosphatase: 55 U/L (ref 38–126)
Anion gap: 9 (ref 5–15)
BUN: 8 mg/dL (ref 8–23)
CO2: 24 mmol/L (ref 22–32)
Calcium: 9.2 mg/dL (ref 8.9–10.3)
Chloride: 105 mmol/L (ref 98–111)
Creatinine, Ser: 0.67 mg/dL (ref 0.44–1.00)
GFR, Estimated: >60 mL/min (ref >60)
Glucose, Bld: 107 mg/dL — ABNORMAL HIGH (ref 70–99)
Potassium: 3.5 mmol/L (ref 3.5–5.1)
Sodium: 138 mmol/L (ref 135–145)
Total Bilirubin: 0.8 mg/dL (ref 0.0–1.2)
Total Protein: 7.7 g/dL (ref 6.5–8.1)

## 2024-01-25 LAB — C DIFFICILE QUICK SCREEN W PCR REFLEX
C Diff antigen: POSITIVE — AB
C Diff toxin: NEGATIVE

## 2024-01-25 MED ORDER — ACETAMINOPHEN 500 MG PO TABS
1000.0000 mg | ORAL_TABLET | Freq: Once | ORAL | Status: AC
  Administered 2024-01-25: 1000 mg via ORAL
  Filled 2024-01-25: qty 2

## 2024-01-25 MED ORDER — VANCOMYCIN HCL 125 MG PO CAPS: 125.0000 mg | ORAL_CAPSULE | Freq: Four times a day (QID) | ORAL | 0 refills | Status: AC

## 2024-01-25 MED ORDER — IOHEXOL 300 MG/ML  SOLN
100.0000 mL | Freq: Once | INTRAMUSCULAR | Status: AC | PRN
  Administered 2024-01-25: 100 mL via INTRAVENOUS

## 2024-01-25 NOTE — Discharge Instructions (Signed)
 Take the antibiotic as prescribed and until finished even if diarrhea has stopped.  Please read over the enclosed information regarding C. difficile.  Follow-up with primary care.  Return to the emergency department if your symptoms change, worsen, or not improving and you are unable to schedule an appointment.

## 2024-01-25 NOTE — ED Provider Notes (Signed)
 [facility] Provider Note    Event Date/Time   First MD Initiated Contact with Patient 01/25/24 1329     (approximate)   History   Diarrhea   HPI  Krista Morris is a 73 y.o. female with PMH of hypertension, hemorrhoids, heart murmur who presents for evaluation of diarrhea on going for 6 days. No fevers, vomiting or urinary symptoms. Patient has abdominal pain that is sharp and crampy which resolves when she has a bowel movement. Patient states she has had mucus like stool. Patient had a colonoscopy that was normal at the beginning of August. She also tells me she took antibiotics for 20 days for a sinus infection during July.      Physical Exam   Triage Vital Signs: ED Triage Vitals  Encounter Vitals Group     BP 01/25/24 1147 (!) 157/88     Girls Systolic BP Percentile --      Girls Diastolic BP Percentile --      Boys Systolic BP Percentile --      Boys Diastolic BP Percentile --      Pulse Rate 01/25/24 1147 73     Resp 01/25/24 1147 17     Temp 01/25/24 1147 98.4 F (36.9 C)     Temp Source 01/25/24 1147 Oral     SpO2 01/25/24 1147 95 %     Weight --      Height --      Head Circumference --      Peak Flow --      Pain Score 01/25/24 1145 0     Pain Loc --      Pain Education --      Exclude from Growth Chart --     Most recent vital signs: Vitals:   01/25/24 1147  BP: (!) 157/88  Pulse: 73  Resp: 17  Temp: 98.4 F (36.9 C)  SpO2: 95%   General: Awake, no distress.  CV:  Good peripheral perfusion. RRR. Resp:  Normal effort. CTAB. Abd:  No distention. Soft, non-tender to palpation. Other:     ED Results / Procedures / Treatments   Labs (all labs ordered are listed, but only abnormal results are displayed) Labs Reviewed  COMPREHENSIVE METABOLIC PANEL WITH GFR - Abnormal; Notable for the following components:      Result Value   Glucose, Bld 107 (*)    All other components within normal limits  GASTROINTESTINAL  PANEL BY PCR, STOOL (REPLACES STOOL CULTURE)  C DIFFICILE QUICK SCREEN W PCR REFLEX    LIPASE, BLOOD  CBC  URINALYSIS, ROUTINE W REFLEX MICROSCOPIC   RADIOLOGY  CT abdomen pelvis ordered.  PROCEDURES:  Critical Care performed: No  Procedures   MEDICATIONS ORDERED IN ED: Medications  iohexol  (OMNIPAQUE ) 300 MG/ML solution 100 mL (100 mLs Intravenous Contrast Given 01/25/24 1542)     IMPRESSION / MDM / ASSESSMENT AND PLAN / ED COURSE  I reviewed the triage vital signs and the nursing notes.                             73 year old female presents for evaluation of diarrhea. BP is elevated, otherwise patient NAD on exam. VSS otherwise.  Differential diagnosis includes, but is not limited to, colitis, infectious diarrhea, C diff, diverticulitis, inflammatory bowel, IBS.  Patient's presentation is most consistent with acute complicated illness / injury requiring diagnostic workup.  CMP, CBC and lipase  are unremarkable.   Will obtain a stool sample if patient is able to provide one. Will also check a urine. Patient discussed with my attending and we will get a CT abdomen pelvis as well.    Clinical Course as of 01/25/24 1554  Thu Jan 25, 2024  1535 Patient was able to give a stool sample but it was contaminated with urine so lab will not run it. Will attempt to get another sample if patient is able. [LD]  1553 Care of the patient will be passed off to the oncoming provider, pending stool sample and CT scan. [LD]    Clinical Course User Index [LD] Cleaster Tinnie LABOR, PA-C     FINAL CLINICAL IMPRESSION(S) / ED DIAGNOSES   Final diagnoses:  Diarrhea of presumed infectious origin     Rx / DC Orders   ED Discharge Orders     None        Note:  This document was prepared using Dragon voice recognition software and may include unintentional dictation errors.   Cleaster Tinnie LABOR, PA-C 01/25/24 1554    Waymond Lorelle Cummins, MD 01/25/24 1816

## 2024-01-25 NOTE — ED Triage Notes (Signed)
 PT reports diarrhea that started on Wednesday of last week. PT denies fevers. Pt reports she has been on antibiotics recently for sinus infection.

## 2024-01-25 NOTE — ED Provider Notes (Signed)
-----------------------------------------   3:45 PM on 01/25/2024 -----------------------------------------  Blood pressure (!) 157/88, pulse 73, temperature 98.4 F (36.9 C), temperature source Oral, resp. rate 17, SpO2 95%.  Assuming care from Tinnie Hudson, PA-C.  In short, Krista Morris is a 73 y.o. female with a chief complaint of Diarrhea .  Refer to the original H&P for additional details.  The current plan of care is to await results of CT.    Clinical Course as of 01/25/24 1823  Thu Jan 25, 2024  1535 Patient was able to give a stool sample but it was contaminated with urine so lab will not run it. Will attempt to get another sample if patient is able. [LD]  1553 Care of the patient will be passed off to the oncoming provider, pending stool sample and CT scan. [LD]  1626 CT ABDOMEN PELVIS W CONTRAST IMPRESSION: 1. No acute localizing findings in the abdomen or pelvis. 2. Descending and sigmoid colonic diverticulosis without evidence of acute diverticulitis. 3. Additional nonacute findings, as described above. 4.  Aortic Atherosclerosis (ICD10-I70.0   [TT]    Clinical Course User Index [LD] Hudson Tinnie LABOR, PA-C [TT] Waymond Lorelle Cummins, MD   ----------------------------------------- 6:23 PM on 01/25/2024 ----------------------------------------- C. difficile antigen is positive.  CT scan of the abdomen pelvis is negative for acute concerns.  Results discussed with the patient.  Plan will be to discharge her home on vancomycin  and have her follow-up with her primary care provider.  Home care and ER return precautions also discussed.  Patient discharged home in stable condition.   [name], FNP 01/25/24 [name] Waymond Lorelle Cummins, MD 01/25/24 [phone]

## 2024-01-25 NOTE — ED Notes (Signed)
 Stool sample recollected.

## 2024-03-07 ENCOUNTER — Other Ambulatory Visit: Payer: Self-pay

## 2024-03-07 DIAGNOSIS — L304 Erythema intertrigo: Secondary | ICD-10-CM

## 2024-03-07 MED ORDER — HYDROCORTISONE 2.5 % EX CREA: TOPICAL_CREAM | CUTANEOUS | 3 refills | Status: DC

## 2024-03-22 NOTE — Progress Notes (Signed)
 [facility]                    PT Daily Progress Note  Patient's Name:Krista Morris                        MRN #[id number]      Date:03/22/2024 Visit Number: 28   Progress Related to Long and Short Term Goals:                                                                                            []  ROM: []  Strength Pain -     while- [] at rest    [] During activity:  []  Functional goal:  where 0 = completely disabled, 100 equals full function.      []  Other: []  Balance:  Patient / Family Education:    []  Progressed HEP to include:  []  Reviewed previous HEP:   ___________________________________________________________________________________   Skilled Service Provided:  To: []  right   []  left     []  bilateral    []   core/body stability and balance  []  neck   []   shoulder   []  upper arm/  []  elbow   []  lower arm  []  wrist   []  hand   []   fingers  []  back     []  hip    []  upper leg    []  knee  []  lower leg   []   ankle  foot             Ther Ex Therapeutic Procedure CPT 97110 : 30 minutes         Manual Tx Manual Therapy CPT 97140: 10 minutes      E-Stim         Infrared     US  x min             Ice / Heat                Vaso-pneumaticcompress             [x]  ROM [x] joint mob [] inflam Setting: [] Circ  [x] pain [] swelling  [x] Flex [x] Flex [] pain [] Healing [] pain [x] inflam [] edema  [] Bal [x]  ROM [] swelling [] pain [] swelling [x] swelling Temp:         F  Strength       [x]   [] ASTYM [] circulation [] circulation [] inflam [] circulation [] soft tissue mob   Level      [] -1        [] -2      [] -3  Progressive     E  HEP    Exercise          []   [] tension/spasms/soft tissue Mob [] tissue mob       Posture        []   [] Correct malaign [] tissue mob        Settings:        Subjective: No new complaints of pain.  Return to MD 11/10 Treatment notes:PROM today was [facility].  The patient is progressing better with AROM.  S.till working on  strength for AROM above 120 Assessment  of Treatment:  Continue as tolerated.  Patient's Response to Treatment:  []  pain  []  pain    []   flexibility      [x]  endurance      [x]   strength     []   AROM      []   PROM       []   Spasms    []  Posture / alignment    []   Joint Mobility  []  Other:   Functional Improvement Noted:  Increased ability to  []  walk   []  stand for longer periods   []  dress with less help    []  lift     []   reach     []   bend    []  Other:  Remaining Impairment Requiring Continued Treatment:   [x]   flexibility   [x]   ROM   [x]   pain [x]  strength   [x]   weakness    []   balance   []   swelling   []   inflammation   []  Spasm  []  posture/alignment    []   Other:  Plan: [x]   Continue Plan of Care        []   Add:         []   Discharge after next visit    []   Discharge to HEP   []  Other:  PT Billing Documentation Visit Number: 28       Frequently Used Timed Codes Therapeutic Procedure CPT 97110 : 30 minutes Manual Therapy CPT 97140: 10 minutes                  Total Treatment Time: 50 minutes     Randine LITTIE Muss, PT  UE Treatment Log    Date  9/26 9/30 10/2 10/7 10/10 10/16 10/22  10/31  UBE UBE L1 8 UBE 1 UBE L 1 8 L3 8' L3 8' L3 8 L3 8 L3 8  wall walk 2x10 2x10 2x10 2x10 2x10 2x10 2x10 2x10  Pulley 2x5 2x5 2x5 2x5' 2x5' 2x5 2x5 2x5  Flexion 2x10 2x10 2x10 1# 3x10 1# 3x10 1 3x10 1 3x10 2 10x 1 2x10  Scaption 2x10 2x10 2x10 1# 3x10 1# 3x10 1 3x10 1 3x10 2 10x 1 2x10  Abduction          T-Band IR BTB 3x10 BTB 3x10 BTB 3x10 Blue 3x10 Blue 3x10 Blue 3x10 BTB 3x10 BTB 3x10  T-Band ER  0/ 45/  90  BTB 3x10 BTB 3x10 BTB 3x10 Blue 3x10 Blue 3x10 Blue 3x10 BTB 3x10 BTB 3x10  Scap Retraction BTB 3x10 BTB 3x10 BTB 3x10 Blue 3x10 Blue 3x10 Blue 3x10 BTB 3x10 BTB 3x10  Shoulder Extension BTB 3x10 BTB 3x10 BTB 3x10 Blue 3x10 Blue 3x10 Blue 3x10 BTB 3x10 BTB 3x10  Lat Pulls          F. M.  Rows          Tricep Ext          Bicep Curls          Shoulder Shrugs           wall pushups          S/L ER          Horiz Abd @ 90 prone/stand          Horiz Abd @ 120 prone/stand          Prone Extension 3 3x10 3 3x10 3 3x10 3 3x10 3 3x10 3 3x10 3 3x10  3 3x10  Prone Rows 3 3x10 3 3x10 3 3x10 3 3x10 3 3x10 3 3x10 3 3x10 3 3x10  Serratus Punch          Cane ER          Wrist flex/ext          grip          Cane seated push          Supine cane press          Cane supine flexion          Chicken wing          PROM- all planes/ all directions/ grade  10 10 10  10' 10' 10 10 10   Ultrasound/ Infrared          HP/CP  with/without IFC    10' 10' HP

## 2024-05-12 ENCOUNTER — Other Ambulatory Visit: Payer: Self-pay | Admitting: Dermatology

## 2024-05-28 ENCOUNTER — Ambulatory Visit: Attending: Infectious Diseases | Admitting: Infectious Diseases

## 2024-05-28 ENCOUNTER — Encounter: Payer: Self-pay | Admitting: Infectious Diseases

## 2024-05-28 VITALS — BP 139/84 | HR 63 | Temp 97.9°F | Ht 65.0 in | Wt 193.0 lb

## 2024-05-28 DIAGNOSIS — Z9889 Other specified postprocedural states: Secondary | ICD-10-CM | POA: Insufficient documentation

## 2024-05-28 DIAGNOSIS — Z792 Long term (current) use of antibiotics: Secondary | ICD-10-CM | POA: Insufficient documentation

## 2024-05-28 DIAGNOSIS — A0472 Enterocolitis due to Clostridium difficile, not specified as recurrent: Secondary | ICD-10-CM

## 2024-05-28 DIAGNOSIS — R7303 Prediabetes: Secondary | ICD-10-CM | POA: Insufficient documentation

## 2024-05-28 DIAGNOSIS — K589 Irritable bowel syndrome without diarrhea: Secondary | ICD-10-CM | POA: Insufficient documentation

## 2024-05-28 DIAGNOSIS — A0471 Enterocolitis due to Clostridium difficile, recurrent: Secondary | ICD-10-CM | POA: Insufficient documentation

## 2024-05-28 NOTE — Patient Instructions (Signed)
 ou were seen today for recurrent Clostridioides difficile infection, which has persisted despite multiple courses of antibiotics. We also discussed your irritable bowel syndrome (IBS) and its management.  YOUR PLAN:  -RECURRENT CLOSTRIDIOIDES DIFFICILE INFECTION: Recurrent Clostridioides difficile infection is a bacterial infection that causes severe diarrhea and other intestinal issues. After your last course of vancomycin  you have not had any diarrhea for 3 weeks- To prevent recurrence, avoid unnecessary antibiotics, increase dietary fiber intake with foods like steel cut oats, flaxseed powder, sweet potatoes, broccoli, cauliflower, arugula, and kale, and consume plain yogurt with live cultures. Continue taking probiotics as needed. If the infection recurs, we may consider a six-week taper of vancomycin  or fidaxomicin, depending on your insurance coverage. If infections persist, we may refer you to a gastroenterologist for a fecal transplant evaluation.  -IRRITABLE BOWEL SYNDROME (IBS): Irritable bowel syndrome (IBS) is a common disorder that affects the large intestine, causing symptoms like cramping, abdominal pain, bloating, gas, and diarrhea or constipation. Continue using probiotics as needed and avoid known food triggers such as cheese, spicy, and oily foods.  INSTRUCTIONS:  If your C. difficile infection recurs, we may consider a six-week taper of vancomycin  or fidaxomicin, depending on your insurance coverage. If infections persist, we may refer you to a gastroenterologist for a fecal transplant evaluation.

## 2024-05-28 NOTE — Progress Notes (Signed)
 NAME: Krista Morris  DOB: 1950-11-09  MRN: [id number]  Date/Time: 05/28/2024 8:59 AM  Subjective:   ?Pt agreed to the use of AI scribe  Krista Morris is a 74 year old female who presents with recurrent Clostridioides difficile infection. She was referred by Dr. Stanton for evaluation of recurrent C. diff infection.  In July, she was treated with amoxicillin for a sinus infection, which did not resolve after the first course, necessitating a second course of antibiotics. Following this, she underwent a colonoscopy on August 6th.  In September, she began experiencing diarrhea, occurring four to five times a day, including overnight. A stool test on September 4th confirmed C. diff infection ( antigen positive toxin neg and PCR positive), and she was initially treated with a ten-day course of vancomycin  starting September 4th.  Despite treatment, symptoms persisted, and a subsequent stool test in October  ( 10/26)was positive for C. diff. She received additional courses of vancomycin : a ten-day course starting October 23rd, a seven-day course starting October 31st, another seven-day course starting November 7th, and a fourteen-day course starting November 17th. She completed the last course three weeks ago.  Currently, she has one formed bowel movement per day, although not as formed as prior to her illness. She has been taking probiotics since October.  She has a history of shoulder surgery in June, followed by ongoing pain and therapy. She is awaiting further intervention for her right shoulder.  She has a history of pre-diabetes, with her A1c being monitored, and her last recorded A1c was 6.0. She has lost weight post-surgery but noted a slight increase over the holidays.   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    Eczema of external ear    Heart murmur    Hemorrhoids     Hypertension    Nontraumatic complete tear of right rotator cuff    Obesity (BMI 30.0-34.9)    Osteoarthritis of knee    Pericarditis    Porokeratosis    Rotator cuff tendinitis     Past Surgical History:  Procedure Laterality Date   BACK SURGERY     bone spur removal  2012   from foot   CESAREAN SECTION  1982, 1985   Dr Elwin   COLONOSCOPY  2004, 2015   hemorrhoids in 2004 and in 2015 normal screening   COLONOSCOPY N/A 12/27/2023   Procedure: COLONOSCOPY;  Surgeon: Toledo, Ladell POUR, MD;  Location: [facility];  Service: Gastroenterology;  Laterality: N/A;   ENDOMETRIAL BIOPSY  2000, 2002   disordered proliferative; weakly proliferative   POSTERIOR LUMBAR FUSION 2 WITH HARDWARE REMOVAL Right 10/25/2023   Procedure: ARTHROSCOPY, SHOULDER WITH DEBRIDEMENT;  Surgeon: Edie Norleen PARAS, MD;  Location: [facility] OR[facility];  Service: Orthopedics;  Laterality: Right;  RIGHT SHOULDER ARTHROSCOPY WITH DEBRIDEMENT, DECOMPRESSION, ROTATOR CUFF REPAIR, AND BICEPS TENODESIS.   PRE-MALIGNANT / BENIGN SKIN LESION EXCISION     right thigh; ulcerated fibroepithelial polyp   SALPINGECTOMY Right 1990   ectopic    TONSILLECTOMY     TUBAL LIGATION      Social History   Socioeconomic History   Marital status: Married    Spouse name: Marcey   Number of children: 2   Years of education: 16   Highest education level: Not on file  Occupational History   Occupation: retired runner, broadcasting/film/video  Tobacco Use   Smoking  status: Never   Smokeless tobacco: Never  Vaping Use   Vaping status: Never Used  Substance and Sexual Activity   Alcohol use: No    Alcohol/week: 0.0 standard drinks of alcohol   Drug use: No   Sexual activity: Yes    Partners: Male    Birth control/protection: Post-menopausal  Other Topics Concern   Not on file  Social History Narrative   Not on file   Social Drivers of Health   Tobacco Use: Low Risk (05/28/2024)   Patient History    Smoking Tobacco Use: Never    Smokeless Tobacco Use: Never     Passive Exposure: Not on file  Financial Resource Strain: Low Risk  (03/01/2024)   Received from [facility]   Overall Financial Resource Strain (CARDIA)    Difficulty of Paying Living Expenses: Not hard at all  Food Insecurity: No Food Insecurity (03/01/2024)   Received from [facility]   Epic    Within the past 12 months, you worried that your food would run out before you got the money to buy more.: Never true    Within the past 12 months, the food you bought just didn't last and you didn't have money to get more.: Never true  Transportation Needs: No Transportation Needs (03/01/2024)   Received from [facility] - Transportation    In the past 12 months, has lack of transportation kept you from medical appointments or from getting medications?: No    Lack of Transportation (Non-Medical): No  Physical Activity: Not on file  Stress: Not on file  Social Connections: Not on file  Intimate Partner Violence: Not on file  Depression (EYV7-0): Not on file  Alcohol Screen: Not on file  Housing: Low Risk  (03/01/2024)   Received from [facility]   Epic    In the last 12 months, was there a time when you were not able to pay the mortgage or rent on time?: No    In the past 12 months, how many times have you moved where you were living?: 0    At any time in the past 12 months, were you homeless or living in a shelter (including now)?: No  Utilities: Not At Risk (03/01/2024)   Received from [facility]   Epic    In the past 12 months has the electric, gas, oil, or water company threatened to shut off services in your home?: No  Health Literacy: Not on file    Family History  Problem Relation Age of Onset   Hypertension Mother    Diabetes Father        died from complications of DM   Hypertension Sister    Hypertension Brother    Breast cancer Cousin        paternal x2, age 56 and age 3    Allergies[1] I? Current Outpatient Medications  Medication Sig Dispense Refill   amLODipine (NORVASC) 10 MG tablet Take 10 mg by mouth every morning.  2   aspirin 81 MG EC tablet Take 81 mg by mouth daily.     calcipotriene -betamethasone  (TACLONEX) external suspension Apply topically to ears daily up to 2 weeks at a time as needed. 60 g 2   cetirizine (ZYRTEC) 10 MG tablet Take 10 mg by mouth daily as needed for allergies.     Cholecalciferol (VITAMIN D3) 1000 UNITS CAPS Take 1,000 Units by mouth daily.  desoximetasone  (TOPICORT ) 0.25 % cream APPLY TO AFFECTED AREAS ON BODY TWICE DAILY AS NEEDED FOR ITCH,AVOID APPLYING TO FACE, GROIN, AND AXILLA 60 g 1   Fluocinolone  Acetonide 0.01 % OIL Apply to ears once a day as needed. 20 mL 5   hydrochlorothiazide (HYDRODIURIL) 25 MG tablet Take 12.5 mg by mouth daily.     hydrocortisone  2.5 % cream Mix hydrocortisone  with ketaconazole 2% twice a day. If improved, decrease to hydrocortisone  and ketaconazole mixed once a day. If still clear, decrease to ketaconazole only 28 g 3   ketoconazole  (NIZORAL ) 2 % cream Apply to skin folds once to twice daily as directed. 60 g 5   losartan (COZAAR) 50 MG tablet Take 50 mg by mouth every morning.  3   metoprolol tartrate (LOPRESSOR) 50 MG tablet Take 75 mg by mouth 2 (two) times daily.     Tapinarof  (VTAMA ) 1 % CREA Apply to ears once a day. 60 g 2   No current facility-administered medications for this visit.     Abtx:  Anti-infectives (From admission, onward)    None       REVIEW OF SYSTEMS:  Const: negative fever, negative chills, negative weight loss Eyes: negative diplopia or visual changes, negative eye pain ENT: negative coryza, negative sore throat Resp: negative cough, hemoptysis, dyspnea Cards: negative for chest pain, palpitations, lower extremity edema GU: negative for frequency, dysuria and hematuria GI: Negative for abdominal pain, diarrhea, bleeding, constipation Skin: negative  for rash and pruritus Heme: negative for easy bruising and gum/nose bleeding MS: negative for myalgias, arthralgias, back pain and muscle weakness Neurolo:negative for headaches, dizziness, vertigo, memory problems  Psych: negative for feelings of anxiety, depression  Endocrine: negative for thyroid, diabetes Allergy/Immunology- negative for any medication or food allergies ? Pertinent Positives include : Objective:  VITALS:  BP 139/84   Pulse 63   Temp 97.9 F (36.6 C) (Temporal)   Ht 5' 5 (1.651 m)   Wt 193 lb (87.5 kg)   SpO2 96%   BMI 32.12 kg/m  LDA Foley Central line Other drainage tubes PHYSICAL EXAM:  General: Alert, cooperative, no distress, appears stated age.  Head: Normocephalic, without obvious abnormality, atraumatic. Eyes: Conjunctivae clear, anicteric sclerae. Pupils are equal ENT Nares normal. No drainage or sinus tenderness. Lips, mucosa, and tongue normal. No Thrush Neck: Supple, symmetrical, no adenopathy, thyroid: non tender no carotid bruit and no JVD. Back: No CVA tenderness. Lungs: Clear to auscultation bilaterally. No Wheezing or Rhonchi. No rales. Heart: Regular rate and rhythm, no murmur, rub or gallop. Abdomen: Soft, non-tender,not distended. Bowel sounds normal. No masses Extremities: atraumatic, no cyanosis. No edema. No clubbing Skin: No rashes or lesions. Or bruising Lymph: Cervical, supraclavicular normal. Neurologic: Grossly non-focal Pertinent Labs Lab Results CBC    Component Value Date/Time   WBC 7.3 01/25/2024 1148   RBC 4.64 01/25/2024 1148   HGB 12.5 01/25/2024 1148   HCT 38.1 01/25/2024 1148   PLT 208 01/25/2024 1148   MCV 82.1 01/25/2024 1148   MCH 26.9 01/25/2024 1148   MCHC 32.8 01/25/2024 1148   RDW 15.2 01/25/2024 1148       Latest Ref Rng & Units 01/25/2024   11:48 AM 10/19/2023    2:33 PM  CMP  Glucose 70 - 99 mg/dL 892  97   BUN 8 - 23 mg/dL 8  13   Creatinine 9.55 - 1.00 mg/dL 9.32  9.26   Sodium 864 - 145  mmol/L 138  140  Potassium 3.5 - 5.1 mmol/L 3.5  3.8   Chloride 98 - 111 mmol/L 105  108   CO2 22 - 32 mmol/L 24  24   Calcium 8.9 - 10.3 mg/dL 9.2  9.2   Total Protein 6.5 - 8.1 g/dL 7.7    Total Bilirubin 0.0 - 1.2 mg/dL 0.8    Alkaline Phos 38 - 126 U/L 55    AST 15 - 41 U/L 16    ALT 0 - 44 U/L 10        Microbiology: 01/25/24 Antigen positive, toxin neg and PCR  positive ?10/21 25 GI- PCR positive for cdiff 05/15/24 PCR positive Impression/Recommendation ? ?Recurrent Clostridioides difficile infection Recurrent C. difficile infection with persistent positive tests despite multiple courses of vancomycin . Symptoms have resolved with current treatment, but colonization may persist. Emphasized the importance of avoiding unnecessary antibiotics to prevent recurrence. Discussed dietary modifications to support gut health and prevent recurrence. Explained that testing for clearance is not advisable due to potential colonization without active infection. - Avoid unnecessary antibiotics. - Increase dietary fiber intake with foods like steel cut oats, flaxseed powder, sweet potatoes, broccoli, cauliflower, arugula, and kale. - Consume plain yogurt with live cultures, such as Greek yogurt. - Consider probiotics as needed. - If recurrence occurs, will consider a six-week taper of vancomycin  or fidaxomicin, depending on insurance coverage. - If recurrent infections persist, will consider referral to a gastroenterologist for fecal transplant evaluation.  Irritable bowel syndrome IBS with known food triggers such as cheese, spicy, and oily foods. Symptoms have improved with probiotic use. Symptoms have improved with probiotic use. - Continue probiotic use as needed. - Avoid known food triggers such as cheese, spicy, and oily foods. ? _Discussed the management with the patient in detail    ________________________________________________ Follow PRN     [1]  Allergies Allergen Reactions    Metronidazole Itching

## 2024-06-10 ENCOUNTER — Other Ambulatory Visit: Payer: Self-pay | Admitting: Surgery

## 2024-06-12 NOTE — Progress Notes (Signed)
 " Surgical Admission History & Physical   NAME: Krista Morris  H&P Date: 06/13/2024   Procedure Date: 06/20/2024  Chief Complaint: right shoulder pain  HPI  Krista Morris is a 74 y.o. female who has severe right shoulder pain. Patient has failed conservative treatment including NSAID's, PT, HEP, and activity modification.  The patient states that the shoulder pain and lack of ROM has progressed to the point that it is significantly interfering with her activities of daily living. She has requested operative intervention for relief of symptoms.    Patient denies recent changes to medical history. She reports no relevant cardiac or pulmonary history. No previous joint surgery.  Social history: Denies smoking, alcohol, and ilicit drug use.   Medications & Allergies  Allergies: Allergies  Allergen Reactions   Flagyl [Metronidazole Hcl] Unknown    Home Medicines: Current Outpatient Medications on File Prior to Visit  Medication Sig Dispense Refill   amLODIPine (NORVASC) 10 MG tablet TAKE 1 TABLET EVERY DAY 90 tablet 3   aspirin 81 MG EC tablet Take 81 mg by mouth once daily     calcipotriene -betamethasone  (TACLONEX SCALP) topical suspension Apply topically to ears daily up to 2 weeks at a time as needed.     cetirizine (ZYRTEC) 10 MG tablet      cholecalciferol (VITAMIN D3) 1,000 unit capsule Take 1,000 Units by mouth once daily 2 tablets a day       desoximetasone  (TOPICORT ) 0.25 % cream Apply to affected areas on body twice daily as needed for itch,Avoid applying to face, groin, and axilla     fluocinolone  acetonide (DERMOTIC ) 0.01 % otic drop Apply to ears once a day as needed.     hydrocortisone  2.5 % cream Mix hydrocortisone  with ketaconazole 2% twice a day. If improved, decrease to hydrocortisone  and ketaconazole mixed once a day. If still clear, decrease to ketaconazole only     ibuprofen (MOTRIN) 200 MG tablet Take 200 mg by mouth every 6 (six) hours as needed for Pain      ipratropium (ATROVENT) 21 mcg (0.03 %) nasal spray ONE TO TWO SPRAYS EACH NOSTRIL 3X A DAY AS NEEDED RHINITIS     ketoconazole  (NIZORAL ) 2 % cream Mix hydrocortisone  with ketaconazole 2% twice a day. If improved, decrease to hydrocortisone  and ketaconazole mixed once a day. If still clear, decrease to ketaconazole only     losartan (COZAAR) 50 MG tablet TAKE 1 TABLET EVERY DAY 90 tablet 3   metoprolol TARTrate (LOPRESSOR) 50 MG tablet TAKE 1 AND 1/2 TABLETS TWICE DAILY 270 tablet 3   mometasone  (ELOCON ) 0.1 % lotion Start Mometasone  lotion, apply to affected area in ears twice daily per itch     VTAMA  1 % Crea      No current facility-administered medications on file prior to visit.    Medical / Surgical History   Past Medical History:  Diagnosis Date   Arthritis    Chickenpox    Ectopic pregnancy, tubal (HHS-HCC)    Hemorrhoids    Hypertension    New onset type 2 diabetes mellitus (CMS/HHS-HCC) 09/05/2022   Pericarditis (HHS-HCC)    viral 2010     Past Surgical History:  Procedure Laterality Date   COLONOSCOPY  05/23/2013   due 05/2023   ARTHROSCOPY SHOULDER Right 10/25/2023   Dr. Edie   [email or web address]   12/27/2023   IntHem/NoRpt/TKT   CESAREAN SECTION     x 3   TONSILLECTOMY     TUBAL LIGATION  Physical Exam   Ht:162.6 cm (5' 4) Wt:89 kg (196 lb 3.2 oz) BMI: Body mass index is 33.68 kg/m.  General/Constitutional: No apparent distress: well-nourished and well developed. Eyes: Pupils equal, round with synchronous movement. Lymphatic: No palpable adenopathy. Respiratory: Non-labored breathing. Lungs clear to auscultation. No abnormal breath sounds. Cardiac: RRR. S1 and S2 noted. No murmurs or gallops. Vascular: No edema, swelling or tenderness, except as noted in detailed exam. Integumentary: No impressive skin lesions present, except as noted in detailed exam. Neuro/Psych: Normal mood and affect, oriented to person, place and  time. Musculoskeletal:   Right shoulder exam On inspection, her surgical incision and arthroscopic portal sites again are well-healed and without evidence for infection.  No swelling, erythema, ecchymosis, abrasions, or other skin abnormalities are identified.  There is no tenderness to palpation over the anterior or lateral aspects of the shoulder.  Actively, she is able to forward flex to 115 degrees, abduct to 100 degrees, and internally rotate to L3.  Passively, she is able to tolerate forward flexion to 125 degrees and abduction to 105 degrees.  At 90 degrees of abduction, she is able to tolerate external rotation to 70 degrees and internal rotation to 50 degrees.  She exhibits 4-4+/5 strength with resisted internal and external rotation, and 4/5 strength with resisted forward flexion and abduction.  She denies any pain with resisted strength testing.  She is grossly neurovascularly intact to the right upper extremity and hand.   Imaging  No new imaging  Previous imaging on 03/17/2023:  Type II acromion. Moderate AC arthritis change with osteophyte formation, joint space narrowing. Mild glenohumeral arthritis change with osteophyte formation. The distal clavicle, scapula, and proximal humerus are intact. Glenohumeral and acromioclavicular joint alignment appears normal. No fractures or dislocations. No soft tissue swelling or joint effusion. No destructive bony lesion is identified. Mild-moderate enthesopathic changes to the greater tuberosity.   Assesment and Plan  Adhesive capsulitis of right shoulder  1.  Treatment options were discussed today with the patient. 2.  The patient is instructed on the risk and benefits of a right shoulder manipulation with steroid injection under anesthesia. 3.  Procedure is scheduled with Dr. Edie on 06/20/2024. 4.  The patient was instructed on the risk and benefits of surgical intervention and wishes proceed at this time.  This document will serve as a  surgical history and physical for the patient. 5.  The patient will follow-up per standard postop protocol.  They can call the clinic they have any questions, new symptoms develop or symptoms worsen.   The procedure was discussed with the patient, as were the potential risks (including bleeding, infection, nerve and/or blood vessel injury, persistent or recurrent pain, failure of the repair, progression of arthritis, need for further surgery, blood clots, strokes, heart attacks and/or arhythmias, pneumonia, etc.) and benefits.  The patient states understanding and wishes to proceed.  Vick Dunk, PA-C [facility]   "

## 2024-06-14 ENCOUNTER — Other Ambulatory Visit: Payer: Self-pay

## 2024-06-14 ENCOUNTER — Encounter
Admission: RE | Admit: 2024-06-14 | Discharge: 2024-06-14 | Disposition: A | Source: Ambulatory Visit | Attending: Surgery

## 2024-06-14 VITALS — Ht 65.0 in | Wt 190.0 lb

## 2024-06-14 DIAGNOSIS — Z01818 Encounter for other preprocedural examination: Secondary | ICD-10-CM | POA: Diagnosis present

## 2024-06-14 DIAGNOSIS — I10 Essential (primary) hypertension: Secondary | ICD-10-CM | POA: Insufficient documentation

## 2024-06-14 DIAGNOSIS — Z01810 Encounter for preprocedural cardiovascular examination: Secondary | ICD-10-CM

## 2024-06-14 NOTE — Patient Instructions (Addendum)
 Your procedure is scheduled on:  THURSDAY JANUARY 29  Report to the Registration Desk on the 1st floor of the [facility] To find out your arrival time, please call [phone] between 1PM - 3PM on:  [facility]  JANUARY 28  If your arrival time is 6:00 am, do not arrive before that time as the [facility] entrance doors do not open until 6:00 am.  REMEMBER: Instructions that are not followed completely may result in serious medical risk, up to and including death; or upon the discretion of your surgeon and anesthesiologist your surgery may need to be rescheduled.  Do not eat food after midnight the night before surgery.  No gum chewing or hard candies.  You may however, drink CLEAR liquids up to 3 hours before you are scheduled to arrive for your surgery. Do not drink anything within 3 hours of your scheduled arrival time.  Clear liquids include: - water  - apple juice without pulp - gatorade (not RED colors) - black coffee or tea (Do NOT add milk or creamers to the coffee or tea) Do NOT drink anything that is not on this list.  In addition, your doctor has ordered for you to drink the provided:  Ensure Pre-Surgery Clear Carbohydrate Drink  Drinking this carbohydrate drink up to three hours before surgery helps to reduce insulin resistance and improve patient outcomes. Please complete drinking 3 hours before scheduled arrival time.  One week prior to surgery: Stop Anti-inflammatories (NSAIDS) such as Advil, Aleve, Ibuprofen, Motrin, Naproxen, Naprosyn and Aspirin based products such as Excedrin, Goody's Powder, BC Powder. Stop ANY OVER THE COUNTER supplements until after surgery.  You may however, continue to take Tylenol  if needed for pain up until the day of surgery.  Continue taking all of your other prescription medications up until the day of surgery.  ON THE DAY OF SURGERY ONLY TAKE THESE MEDICATIONS WITH SIPS OF WATER:  metoprolol tartrate (LOPRESSOR)    No Alcohol  for 24 hours before or after surgery.  Do not use any recreational drugs for at least a week (preferably 2 weeks) before your surgery.  Please be advised that the combination of cocaine and anesthesia may have negative outcomes, up to and including death. If you test positive for cocaine, your surgery will be cancelled.  On the morning of surgery brush your teeth with toothpaste and water, you may rinse your mouth with mouthwash if you wish. Do not swallow any toothpaste or mouthwash.  Use CHG Soap as directed on instruction sheet.  Do not wear jewelry, make-up, hairpins, clips or nail polish.  For welded (permanent) jewelry: bracelets, anklets, waist bands, etc.  Please have this removed prior to surgery.  If it is not removed, there is a chance that hospital personnel will need to cut it off on the day of surgery.  Do not wear lotions, powders, or perfumes.   Do not shave body hair from the neck down 48 hours before surgery.  Do not bring valuables to the hospital. [facility] is not responsible for any missing/lost belongings or valuables.   Notify your doctor if there is any change in your medical condition (cold, fever, infection).  Wear comfortable clothing (specific to your surgery type) to the hospital.  After surgery, you can help prevent lung complications by doing breathing exercises.  Take deep breaths and cough every 1-2 hours. Your doctor may order a device called an Incentive Spirometer to help you take deep breaths.  If you are  being discharged the day of surgery, you will not be allowed to drive home. You will need a responsible individual to drive you home and stay with you for 24 hours after surgery.   If you are taking public transportation, you will need to have a responsible individual with you.  Please call the Pre-admissions Testing Dept. at [phone] if you have any questions about these instructions.  Surgery Visitation Policy:  Patients having  surgery or a procedure may have two visitors.  Children under the age of 86 must have an adult with them who is not the patient.  Inpatient Visitation:    Visiting hours are 7 a.m. to 8 p.m. Up to four visitors are allowed at one time in a patient room. The visitors may rotate out with other people during the day.  One visitor age 4 or older may stay with the patient overnight and must be in the room by 8 p.m.   Merchandiser, Retail to address health-related social needs:  [email or web address]                                                                                                              Preparing for Surgery with CHLORHEXIDINE  GLUCONATE (CHG) Soap  Chlorhexidine  Gluconate (CHG) Soap  o An antiseptic cleaner that kills germs and bonds with the skin to continue killing germs even after washing  o Used for showering the night before surgery and morning of surgery  Before surgery, you can play an important role by reducing the number of germs on your skin.  CHG (Chlorhexidine  gluconate) soap is an antiseptic cleanser which kills germs and bonds with the skin to continue killing germs even after washing.  Please do not use if you have an allergy to CHG or antibacterial soaps. If your skin becomes reddened/irritated stop using the CHG.  1. Shower the NIGHT BEFORE SURGERY with CHG soap.  2. If you choose to wash your hair, wash your hair first as usual with your normal shampoo.  3. After shampooing, rinse your hair and body thoroughly to remove the shampoo.  4. Use CHG as you would any other liquid soap. You can apply CHG directly to the skin and wash gently with a clean washcloth.  5. Apply the CHG soap to your body only from the neck down. Do not use on open wounds or open sores. Avoid contact with your eyes, ears, mouth, and genitals (private parts). Wash face and genitals (private parts) with your normal soap.  6. Wash thoroughly, paying special  attention to the area where your surgery will be performed.  7. Thoroughly rinse your body with warm water.  8. Do not shower/wash with your normal soap after using and rinsing off the CHG soap.  9. Do not use lotions, oils, etc., after showering with CHG.  10. Pat yourself dry with a clean towel.  11. Wear clean pajamas to bed the night before surgery.  12. Place clean sheets on your bed the night of your shower and do not  sleep with pets.  13. Do not apply any deodorants/lotions/powders.  14. Please wear clean clothes to the hospital.  15. Remember to brush your teeth with your regular toothpaste.   How to Use an Incentive Spirometer  An incentive spirometer is a tool that measures how well you are filling your lungs with each breath. Learning to take long, deep breaths using this tool can help you keep your lungs clear and active. This may help to reverse or lessen your chance of developing breathing (pulmonary) problems, especially infection. You may be asked to use a spirometer: After a surgery. If you have a lung problem or a history of smoking. After a long period of time when you have been unable to move or be active. If the spirometer includes an indicator to show the highest number that you have reached, your health care provider or respiratory therapist will help you set a goal. Keep a log of your progress as told by your health care provider. What are the risks? Breathing too quickly may cause dizziness or cause you to pass out. Take your time so you do not get dizzy or light-headed. If you are in pain, you may need to take pain medicine before doing incentive spirometry. It is harder to take a deep breath if you are having pain. How to use your incentive spirometer  Sit up on the edge of your bed or on a chair. Hold the incentive spirometer so that it is in an upright position. Before you use the spirometer, breathe out normally. Place the mouthpiece in your mouth.  Make sure your lips are closed tightly around it. Breathe in slowly and as deeply as you can through your mouth, causing the piston or the ball to rise toward the top of the chamber. Hold your breath for 3-5 seconds, or for as long as possible. If the spirometer includes a coach indicator, use this to guide you in breathing. Slow down your breathing if the indicator goes above the marked areas. Remove the mouthpiece from your mouth and breathe out normally. The piston or ball will return to the bottom of the chamber. Rest for a few seconds, then repeat the steps 10 or more times. Take your time and take a few normal breaths between deep breaths so that you do not get dizzy or light-headed. Do this every 1-2 hours when you are awake. If the spirometer includes a goal marker to show the highest number you have reached (best effort), use this as a goal to work toward during each repetition. After each set of 10 deep breaths, cough a few times. This will help to make sure that your lungs are clear. If you have an incision on your chest or abdomen from surgery, place a pillow or a rolled-up towel firmly against the incision when you cough. This can help to reduce pain while taking deep breaths and coughing. General tips When you are able to get out of bed: Walk around often. Continue to take deep breaths and cough in order to clear your lungs. Keep using the incentive spirometer until your health care provider says it is okay to stop using it. If you have been in the hospital, you may be told to keep using the spirometer at home. Contact a health care provider if: You are having difficulty using the spirometer. You have trouble using the spirometer as often as instructed. Your pain medicine is not giving enough relief for you to use the spirometer as told.  You have a fever. Get help right away if: You develop shortness of breath. You develop a cough with bloody mucus from the lungs. You have fluid  or blood coming from an incision site after you cough. Summary An incentive spirometer is a tool that can help you learn to take long, deep breaths to keep your lungs clear and active. You may be asked to use a spirometer after a surgery, if you have a lung problem or a history of smoking, or if you have been inactive for a long period of time. Use your incentive spirometer as instructed every 1-2 hours while you are awake. If you have an incision on your chest or abdomen, place a pillow or a rolled-up towel firmly against your incision when you cough. This will help to reduce pain. Get help right away if you have shortness of breath, you cough up bloody mucus, or blood comes from your incision when you cough. This information is not intended to replace advice given to you by your health care provider. Make sure you discuss any questions you have with your health care provider. Document Revised: 07/29/2019 Document Reviewed: 07/29/2019 Elsevier Patient Education  2023 [employer].

## 2024-06-20 ENCOUNTER — Encounter: Payer: Self-pay | Admitting: Urgent Care

## 2024-06-20 ENCOUNTER — Ambulatory Visit
Admission: RE | Admit: 2024-06-20 | Discharge: 2024-06-20 | Disposition: A | Discharge status: Home / Self Care | Attending: Surgery | Admitting: Surgery

## 2024-06-20 ENCOUNTER — Other Ambulatory Visit: Payer: Self-pay

## 2024-06-20 ENCOUNTER — Ambulatory Visit: Admitting: Anesthesiology

## 2024-06-20 ENCOUNTER — Encounter
Admission: RE | Disposition: A | Discharge status: Home / Self Care | Payer: Self-pay | Source: Home / Self Care | Attending: Surgery

## 2024-06-20 ENCOUNTER — Encounter: Payer: Self-pay | Admitting: Surgery

## 2024-06-20 DIAGNOSIS — E669 Obesity, unspecified: Secondary | ICD-10-CM | POA: Insufficient documentation

## 2024-06-20 DIAGNOSIS — M7501 Adhesive capsulitis of right shoulder: Secondary | ICD-10-CM | POA: Insufficient documentation

## 2024-06-20 DIAGNOSIS — I10 Essential (primary) hypertension: Secondary | ICD-10-CM | POA: Insufficient documentation

## 2024-06-20 DIAGNOSIS — Z9889 Other specified postprocedural states: Secondary | ICD-10-CM | POA: Diagnosis not present

## 2024-06-20 DIAGNOSIS — L304 Erythema intertrigo: Secondary | ICD-10-CM

## 2024-06-20 DIAGNOSIS — Z6831 Body mass index (BMI) 31.0-31.9, adult: Secondary | ICD-10-CM | POA: Insufficient documentation

## 2024-06-20 DIAGNOSIS — E119 Type 2 diabetes mellitus without complications: Secondary | ICD-10-CM | POA: Diagnosis not present

## 2024-06-20 LAB — GLUCOSE, CAPILLARY
Glucose-Capillary: 101 mg/dL — ABNORMAL HIGH (ref 70–99)
Glucose-Capillary: 117 mg/dL — ABNORMAL HIGH (ref 70–99)

## 2024-06-20 MED ORDER — METOCLOPRAMIDE HCL 10 MG PO TABS: 5.0000 mg | ORAL_TABLET | Freq: Three times a day (TID) | ORAL | Status: DC | PRN

## 2024-06-20 MED ORDER — FENTANYL CITRATE (PF) 100 MCG/2ML IJ SOLN
25.0000 ug | INTRAMUSCULAR | Status: DC | PRN
  Administered 2024-06-20: 50 ug via INTRAVENOUS

## 2024-06-20 MED ORDER — MIDAZOLAM HCL 2 MG/2ML IJ SOLN
INTRAMUSCULAR | Status: AC
  Filled 2024-06-20: qty 2

## 2024-06-20 MED ORDER — CEFAZOLIN SODIUM-DEXTROSE 2-4 GM/100ML-% IV SOLN
INTRAVENOUS | Status: AC
  Filled 2024-06-20: qty 100

## 2024-06-20 MED ORDER — SODIUM CHLORIDE 0.9 % IV SOLN: INTRAVENOUS | Status: DC

## 2024-06-20 MED ORDER — CHLORHEXIDINE GLUCONATE 0.12 % MT SOLN
OROMUCOSAL | Status: AC
  Filled 2024-06-20: qty 15

## 2024-06-20 MED ORDER — BUPIVACAINE-EPINEPHRINE (PF) 0.25% -1:200000 IJ SOLN
INTRAMUSCULAR | Status: DC | PRN
  Administered 2024-06-20: 9 mL

## 2024-06-20 MED ORDER — FENTANYL CITRATE (PF) 100 MCG/2ML IJ SOLN
INTRAMUSCULAR | Status: AC
  Filled 2024-06-20: qty 2

## 2024-06-20 MED ORDER — KETOROLAC TROMETHAMINE 15 MG/ML IJ SOLN
15.0000 mg | Freq: Once | INTRAMUSCULAR | Status: AC
  Administered 2024-06-20: 15 mg via INTRAVENOUS

## 2024-06-20 MED ORDER — MIDAZOLAM HCL (PF) 2 MG/2ML IJ SOLN
INTRAMUSCULAR | Status: DC | PRN
  Administered 2024-06-20: 1 mg via INTRAVENOUS

## 2024-06-20 MED ORDER — ACETAMINOPHEN 10 MG/ML IV SOLN: 1000.0000 mg | Freq: Once | INTRAVENOUS | Status: DC | PRN

## 2024-06-20 MED ORDER — ONDANSETRON HCL 4 MG/2ML IJ SOLN: 4.0000 mg | Freq: Once | INTRAMUSCULAR | Status: DC | PRN

## 2024-06-20 MED ORDER — ACETAMINOPHEN 325 MG PO TABS: 325.0000 mg | ORAL_TABLET | Freq: Four times a day (QID) | ORAL | Status: DC | PRN

## 2024-06-20 MED ORDER — ONDANSETRON HCL 4 MG/2ML IJ SOLN: 4.0000 mg | Freq: Four times a day (QID) | INTRAMUSCULAR | Status: DC | PRN

## 2024-06-20 MED ORDER — CEFAZOLIN SODIUM-DEXTROSE 2-4 GM/100ML-% IV SOLN: 2.0000 g | INTRAVENOUS | Status: DC

## 2024-06-20 MED ORDER — CHLORHEXIDINE GLUCONATE 0.12 % MT SOLN
15.0000 mL | Freq: Once | OROMUCOSAL | Status: AC
  Administered 2024-06-20: 15 mL via OROMUCOSAL

## 2024-06-20 MED ORDER — FENTANYL CITRATE (PF) 100 MCG/2ML IJ SOLN
INTRAMUSCULAR | Status: DC | PRN
  Administered 2024-06-20: 50 ug via INTRAVENOUS

## 2024-06-20 MED ORDER — HYDROCORTISONE 2.5 % EX CREA: TOPICAL_CREAM | CUTANEOUS | 3 refills | Status: AC

## 2024-06-20 MED ORDER — TRIAMCINOLONE ACETONIDE 40 MG/ML IJ SUSP
INTRAMUSCULAR | Status: DC | PRN
  Administered 2024-06-20: 40 mg via INTRAMUSCULAR

## 2024-06-20 MED ORDER — OXYCODONE HCL 5 MG PO TABS
5.0000 mg | ORAL_TABLET | Freq: Once | ORAL | Status: AC | PRN
  Administered 2024-06-20: 5 mg via ORAL

## 2024-06-20 MED ORDER — ONDANSETRON HCL 4 MG PO TABS: 4.0000 mg | ORAL_TABLET | Freq: Four times a day (QID) | ORAL | Status: DC | PRN

## 2024-06-20 MED ORDER — ORAL CARE MOUTH RINSE: 15.0000 mL | Freq: Once | OROMUCOSAL | Status: AC

## 2024-06-20 MED ORDER — HYDROCODONE-ACETAMINOPHEN 5-325 MG PO TABS: 1.0000 | ORAL_TABLET | ORAL | Status: DC | PRN

## 2024-06-20 MED ORDER — PROPOFOL 500 MG/50ML IV EMUL
INTRAVENOUS | Status: DC | PRN
  Administered 2024-06-20: 100 mg via INTRAVENOUS
  Administered 2024-06-20: 120 ug/kg/min via INTRAVENOUS
  Administered 2024-06-20: 20 mg via INTRAVENOUS

## 2024-06-20 MED ORDER — LIDOCAINE HCL (CARDIAC) PF 100 MG/5ML IV SOSY
PREFILLED_SYRINGE | INTRAVENOUS | Status: DC | PRN
  Administered 2024-06-20: 80 mg via INTRAVENOUS

## 2024-06-20 MED ORDER — OXYCODONE HCL 5 MG/5ML PO SOLN: 5.0000 mg | Freq: Once | ORAL | Status: AC | PRN

## 2024-06-20 MED ORDER — METOCLOPRAMIDE HCL 5 MG/ML IJ SOLN: 5.0000 mg | Freq: Three times a day (TID) | INTRAMUSCULAR | Status: DC | PRN

## 2024-06-20 MED ORDER — LACTATED RINGERS IV SOLN: INTRAVENOUS | Status: DC

## 2024-06-20 MED ORDER — KETOROLAC TROMETHAMINE 15 MG/ML IJ SOLN
INTRAMUSCULAR | Status: AC
  Filled 2024-06-20: qty 1

## 2024-06-20 MED ORDER — PROPOFOL 10 MG/ML IV BOLUS
INTRAVENOUS | Status: AC
  Filled 2024-06-20: qty 40

## 2024-06-20 MED ORDER — OXYCODONE HCL 5 MG PO TABS
ORAL_TABLET | ORAL | Status: AC
  Filled 2024-06-20: qty 1

## 2024-06-20 MED ORDER — BUPIVACAINE-EPINEPHRINE (PF) 0.25% -1:200000 IJ SOLN
INTRAMUSCULAR | Status: AC
  Filled 2024-06-20: qty 30

## 2024-06-20 MED ORDER — ONDANSETRON HCL 4 MG/2ML IJ SOLN
INTRAMUSCULAR | Status: DC | PRN
  Administered 2024-06-20: 4 mg via INTRAVENOUS

## 2024-06-20 MED ORDER — TRIAMCINOLONE ACETONIDE 40 MG/ML IJ SUSP
INTRAMUSCULAR | Status: AC
  Filled 2024-06-20: qty 1

## 2024-06-20 NOTE — Anesthesia Preprocedure Evaluation (Addendum)
"                                    Anesthesia Evaluation  Patient identified by MRN, date of birth, ID band Patient awake    Reviewed: Allergy & Precautions, NPO status , Patient's Chart, lab work & pertinent test results  History of Anesthesia Complications Negative for: history of anesthetic complications  Airway Mallampati: I   Neck ROM: Full    Dental  (+) Missing   Pulmonary neg pulmonary ROS   Pulmonary exam normal breath sounds clear to auscultation       Cardiovascular hypertension, Normal cardiovascular exam Rhythm:Regular Rate:Normal  ECG 06/14/24: Normal sinus rhythm with sinus arrhythmia  Nonspecific ST abnormality   Neuro/Psych negative neurological ROS     GI/Hepatic negative GI ROS,,,  Endo/Other  diabetes, Type 2  Obesity   Renal/GU negative Renal ROS     Musculoskeletal  (+) Arthritis ,    Abdominal   Peds  Hematology  (+) Blood dyscrasia, anemia   Anesthesia Other Findings   Reproductive/Obstetrics                              Anesthesia Physical Anesthesia Plan  ASA: 2  Anesthesia Plan: General   Post-op Pain Management:    Induction: Intravenous  PONV Risk Score and Plan: 3 and Ondansetron , Dexamethasone  and Treatment may vary due to age or medical condition  Airway Management Planned: LMA  Additional Equipment:   Intra-op Plan:   Post-operative Plan: Extubation in OR  Informed Consent: I have reviewed the patients History and Physical, chart, labs and discussed the procedure including the risks, benefits and alternatives for the proposed anesthesia with the patient or authorized representative who has indicated his/her understanding and acceptance.     Dental advisory given  Plan Discussed with: CRNA  Anesthesia Plan Comments: (Patient consented for risks of anesthesia including but not limited to:  - adverse reactions to medications - damage to eyes, teeth, lips or other oral  mucosa - nerve damage due to positioning  - sore throat or hoarseness - damage to heart, brain, nerves, lungs, other parts of body or loss of life  Informed patient about role of CRNA in peri- and intra-operative care.  Patient voiced understanding.)         Anesthesia Quick Evaluation  "

## 2024-06-20 NOTE — Anesthesia Postprocedure Evaluation (Signed)
"   Anesthesia Post Note  Patient: Krista Morris  Procedure(s) Performed: MANIPULATION, JOINT, SHOULDER, WITH ANESTHESIA (Right: Shoulder) INJECTION, STEROID, JOINT, SHOULDER (Right: Shoulder)  Patient location during evaluation: PACU Anesthesia Type: General Level of consciousness: awake and alert, oriented and patient cooperative Pain management: pain level controlled Vital Signs Assessment: post-procedure vital signs reviewed and stable Respiratory status: spontaneous breathing, nonlabored ventilation and respiratory function stable Cardiovascular status: blood pressure returned to baseline and stable Postop Assessment: adequate PO intake Anesthetic complications: no   No notable events documented.   Last Vitals:  Vitals:   06/20/24 1033 06/20/24 1100  BP: (!) 142/85 133/77  Pulse: 76 60  Resp: (!) 27 11  Temp: 36.4 C 36.6 C  SpO2: 96% 94%    Last Pain:  Vitals:   06/20/24 1100  TempSrc:   PainSc: Asleep                 Alfonso Ruths      "

## 2024-06-20 NOTE — Op Note (Signed)
 06/20/2024  10:29 AM  Patient:   Krista Morris  Pre-Op Diagnosis:   Secondary adhesive capsulitis, right shoulder.  Post-Op Diagnosis:   Same  Procedure:   Manipulation under anesthesia with steroid injection, right shoulder.  Surgeon:   DOROTHA Reyes Maltos, MD  Assistant:   None  Anesthesia:   IV sedation with interscalene block placed preoperatively by anesthesiologist.  Findings:   As above. Prior to manipulation, the right shoulder could be forward flexed to 155 and abducted to 145. At 90 of abduction, the shoulder could be externally rotated to 70 and internally rotated to 50. Following manipulation, the shoulder could be forward flexed to 175, abducted to 170 and, at 90 of abduction, externally rotated to 90 and internally rotated to 70.  Complications:   None  EBL:   0 cc  Fluids:   50 cc crystalloid  TT:   None  Drains:   None  Closure:   None  Brief Clinical Note:   The patient is a 74 year old female who is now 7.5 months status post a right shoulder arthroscopy with debridement, decompression, rotator cuff repair, and biceps tenodesis. Despite extensive physical therapy, the patient continues to have pain and difficulty regaining shoulder range of motion. The patient's history and examination are consistent with secondary adhesive capsulitis. The patient presents at this time for a manipulation under anesthesia with steroid injection of the right shoulder.  Procedure:   The patient underwent placement of an interscalene block in the preoperative holding area before being brought into the operating room and lain in the supine position. After adequate IV sedation was achieved, a timeout was performed to verify the correct surgical site. The right shoulder was gently manipulated in both abduction and external rotation, as well as adduction and internal rotation. Several palpable and audible pops were heard as the scar tissue released, permitting full range of  motion of the shoulder. The glenohumeral joint was injected sterilely using 1 cc of Kenalog -40 and 9 cc of 0.25% Sensorcaine  with epinephrine  before the patient was placed into a sling. The patient was then awakened and returned to the recovery room in satisfactory condition after tolerating the procedure well.

## 2024-06-20 NOTE — Discharge Instructions (Addendum)
 Orthopedic discharge instructions: May shower without restrictions.  Apply ice frequently to shoulder. Take ibuprofen 600-800 mg TID with meals for 3-5 days, then as necessary. Take extra strength Tylenol  or pain medication as prescribed when needed.  May wear sling tonight, but sling should be removed by tomorrow morning. May resume using arm as tolerated without restrictions. Begin physical therapy as scheduled tomorrow. May resume daily baby aspirin tomorrow morning. Follow-up in 10-14 days or as scheduled.

## 2024-06-20 NOTE — H&P (Signed)
 History of Present Illness:  Krista Morris is a 74 y.o. female who has moderate right shoulder pain and stiffness. The patient has failed conservative treatment including NSAID's, PT, HEP, and activity modification. The patient states that the shoulder pain and lack of ROM has persisted to the point that it is significantly interfering with her activities of daily living. She also has difficulty sleeping at night. She has requested operative intervention for relief of symptoms.   Patient denies recent changes to medical history. She reports no relevant cardiac or pulmonary history. No previous joint surgery.  Social history: Denies smoking, alcohol, and ilicit drug use.  Allergies: Flagyl [Metronidazole Hcl] Unknown   Home Medicines: Current Outpatient Medications on File Prior to Visit  Medication Sig Dispense Refill  amLODIPine (NORVASC) 10 MG tablet TAKE 1 TABLET EVERY DAY 90 tablet 3  aspirin 81 MG EC tablet Take 81 mg by mouth once daily  calcipotriene -betamethasone  (TACLONEX SCALP) topical suspension Apply topically to ears daily up to 2 weeks at a time as needed.  cetirizine (ZYRTEC) 10 MG tablet  cholecalciferol (VITAMIN D3) 1,000 unit capsule Take 1,000 Units by mouth once daily 2 tablets a day  desoximetasone  (TOPICORT ) 0.25 % cream Apply to affected areas on body twice daily as needed for itch,Avoid applying to face, groin, and axilla  fluocinolone  acetonide (DERMOTIC ) 0.01 % otic drop Apply to ears once a day as needed.  hydrocortisone  2.5 % cream Mix hydrocortisone  with ketaconazole 2% twice a day. If improved, decrease to hydrocortisone  and ketaconazole mixed once a day. If still clear, decrease to ketaconazole only  ibuprofen (MOTRIN) 200 MG tablet Take 200 mg by mouth every 6 (six) hours as needed for Pain  ipratropium (ATROVENT) 21 mcg (0.03 %) nasal spray ONE TO TWO SPRAYS EACH NOSTRIL 3X A DAY AS NEEDED RHINITIS  ketoconazole  (NIZORAL ) 2 % cream Mix hydrocortisone  with  ketaconazole 2% twice a day. If improved, decrease to hydrocortisone  and ketaconazole mixed once a day. If still clear, decrease to ketaconazole only  losartan (COZAAR) 50 MG tablet TAKE 1 TABLET EVERY DAY 90 tablet 3  metoprolol TARTrate (LOPRESSOR) 50 MG tablet TAKE 1 AND 1/2 TABLETS TWICE DAILY 270 tablet 3  mometasone  (ELOCON ) 0.1 % lotion Start Mometasone  lotion, apply to affected area in ears twice daily per itch  VTAMA  1 % Crea   Past Medical History:  Arthritis  Chickenpox  Ectopic pregnancy, tubal ([facility]HS-HCC)  Hemorrhoids  Hypertension  New onset type 2 diabetes mellitus (CMS/[facility]HS-HCC) 09/05/2022  Pericarditis ([facility]) - viral 2010   Past Surgical History:  COLONOSCOPY 05/23/2013  ARTHROSCOPY SHOULDER Right 10/25/2023 (Dr. Edie)  [email or web address]  12/27/2023 (IntHem/NoRpt/TKT)  CESAREAN SECTION x 3  TONSILLECTOMY  TUBAL LIGATION   Physical Exam: Ht:162.6 cm (5' 4) Wt:89 kg (196 lb 3.2 oz) BMI: Body mass index is 33.68 kg/m.  General/Constitutional: No apparent distress: well-nourished and well developed. Eyes: Pupils equal, round with synchronous movement. Lymphatic: No palpable adenopathy. Respiratory: Non-labored breathing. Lungs clear to auscultation. No abnormal breath sounds. Cardiac: RRR. S1 and S2 noted. No murmurs or gallops. Vascular: No edema, swelling or tenderness, except as noted in detailed exam. Integumentary: No impressive skin lesions present, except as noted in detailed exam. Neuro/Psych: Normal mood and affect, oriented to person, place and time. Musculoskeletal:   Right shoulder exam: On inspection, her surgical incision and arthroscopic portal sites again are well-healed and without evidence for infection. No swelling, erythema, ecchymosis, abrasions, or other skin abnormalities are identified. There is no tenderness to palpation over  the anterior or lateral aspects of the shoulder. Actively, she is able to forward flex to 115 degrees, abduct to 100  degrees, and internally rotate to L3. Passively, she is able to tolerate forward flexion to 125 degrees and abduction to 105 degrees. At 90 degrees of abduction, she is able to tolerate external rotation to 70 degrees and internal rotation to 50 degrees. She exhibits 4-4+/5 strength with resisted internal and external rotation, and 4/5 strength with resisted forward flexion and abduction. She denies any pain with resisted strength testing. She is grossly neurovascularly intact to the right upper extremity and hand.   Imaging:  No new imaging  Assessment: 1. Adhesive capsulitis of right shoulder.  2. Rotator cuff tendinitis, right. 3. Status post rotator cuff repair right shoulder.  Plan: The treatment options were discussed with the patient. In addition, patient educational materials were provided regarding the diagnosis and treatment options. The patient is becoming increasingly frustrated by her persistent symptoms and function limitations, and is now ready to consider more aggressive treatment options. Therefore, I have recommended a surgical procedure, specifically a manipulation under anesthesia with steroid injection of the right shoulder. The procedure was discussed with the patient, as were the potential risks (including bleeding, infection, nerve and/or blood vessel injury, persistent or recurrent pain, persistent or recurrent stiffness/weakness of the shoulder, need for further surgery, blood clots, strokes, heart attacks and/or arhythmias, pneumonia, etc.) and benefits. The patient states her understanding and agrees to proceed. All of the patient's questions and concerns were answered. She can call any time with further concerns. She will follow up post-surgery, routine.    H&P reviewed and patient re-examined. No changes.

## 2024-06-20 NOTE — Transfer of Care (Signed)
 Immediate Anesthesia Transfer of Care Note  Patient: Krista Morris  Procedure(s) Performed: MANIPULATION, JOINT, SHOULDER, WITH ANESTHESIA (Right: Shoulder) INJECTION, STEROID, JOINT, SHOULDER (Right: Shoulder)  Patient Location: PACU  Anesthesia Type:MAC  Level of Consciousness: sedated  Airway & Oxygen Therapy: Patient Spontanous Breathing and Patient connected to face mask oxygen  Post-op Assessment: Report given to RN  Post vital signs: Reviewed and stable  Last Vitals:  Vitals Value Taken Time  BP 142/85 06/20/24 10:33  Temp    Pulse 72 06/20/24 10:36  Resp 20 06/20/24 10:36  SpO2 98 % 06/20/24 10:36  Vitals shown include unfiled device data.  Last Pain:  Vitals:   06/20/24 0847  TempSrc: Temporal  PainSc: 5          Complications: No notable events documented.

## 2024-06-21 ENCOUNTER — Encounter: Payer: Self-pay | Admitting: Surgery

## 2024-07-17 ENCOUNTER — Ambulatory Visit: Admitting: Dermatology

## 2024-09-09 ENCOUNTER — Ambulatory Visit: Admitting: Dermatology
